# Patient Record
Sex: Female | Born: 1995 | Race: White | Hispanic: No | Marital: Single | Smoking: Current every day smoker
Health system: Southern US, Community
[De-identification: ages and names within clinical notes are randomized; demographics above are authoritative.]

## PROBLEM LIST (undated history)

## (undated) ENCOUNTER — Inpatient Hospital Stay (HOSPITAL_COMMUNITY): Payer: Self-pay

## (undated) DIAGNOSIS — Z9109 Other allergy status, other than to drugs and biological substances: Secondary | ICD-10-CM

## (undated) DIAGNOSIS — D649 Anemia, unspecified: Secondary | ICD-10-CM

## (undated) DIAGNOSIS — B999 Unspecified infectious disease: Secondary | ICD-10-CM

## (undated) DIAGNOSIS — R51 Headache: Secondary | ICD-10-CM

## (undated) DIAGNOSIS — O021 Missed abortion: Secondary | ICD-10-CM

---
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
## 2013-01-22 ENCOUNTER — Emergency Department (HOSPITAL_COMMUNITY): Payer: Medicaid Other

## 2013-01-22 ENCOUNTER — Emergency Department (HOSPITAL_COMMUNITY)
Admission: EM | Admit: 2013-01-22 | Discharge: 2013-01-22 | Disposition: A | Discharge status: Home / Self Care | Payer: Medicaid Other | Attending: Emergency Medicine | Admitting: Emergency Medicine

## 2013-01-22 DIAGNOSIS — IMO0002 Reserved for concepts with insufficient information to code with codable children: Secondary | ICD-10-CM | POA: Insufficient documentation

## 2013-01-22 DIAGNOSIS — S335XXA Sprain of ligaments of lumbar spine, initial encounter: Secondary | ICD-10-CM | POA: Insufficient documentation

## 2013-01-22 DIAGNOSIS — Y92410 Unspecified street and highway as the place of occurrence of the external cause: Secondary | ICD-10-CM | POA: Insufficient documentation

## 2013-01-22 DIAGNOSIS — F17200 Nicotine dependence, unspecified, uncomplicated: Secondary | ICD-10-CM | POA: Insufficient documentation

## 2013-01-22 DIAGNOSIS — R6884 Jaw pain: Secondary | ICD-10-CM | POA: Insufficient documentation

## 2013-01-22 DIAGNOSIS — Z3202 Encounter for pregnancy test, result negative: Secondary | ICD-10-CM | POA: Insufficient documentation

## 2013-01-22 DIAGNOSIS — S39012A Strain of muscle, fascia and tendon of lower back, initial encounter: Secondary | ICD-10-CM

## 2013-01-22 DIAGNOSIS — S86911A Strain of unspecified muscle(s) and tendon(s) at lower leg level, right leg, initial encounter: Secondary | ICD-10-CM

## 2013-01-22 DIAGNOSIS — Y9389 Activity, other specified: Secondary | ICD-10-CM | POA: Insufficient documentation

## 2013-01-22 LAB — POCT PREGNANCY, URINE: Preg Test, Ur: NEGATIVE

## 2013-01-22 MED ORDER — TRAMADOL HCL 50 MG PO TABS
50.0000 mg | ORAL_TABLET | Freq: Once | ORAL | Status: AC
  Administered 2013-01-22: 50 mg via ORAL
  Filled 2013-01-22: qty 1

## 2013-01-22 MED ORDER — TRAMADOL HCL 50 MG PO TABS: 50.0000 mg | ORAL_TABLET | Freq: Four times a day (QID) | ORAL | Status: DC | PRN

## 2013-01-22 MED ORDER — CYCLOBENZAPRINE HCL 10 MG PO TABS: 10.0000 mg | ORAL_TABLET | Freq: Two times a day (BID) | ORAL | Status: DC | PRN
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
## 2013-01-22 NOTE — ED Provider Notes (Signed)
Medical screening examination/treatment/procedure(s) were performed by non-physician practitioner and as supervising physician I was immediately available for consultation/collaboration.  EKG Interpretation   None        Emmett Bracknell, MD 01/22/13 2336 
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
## 2013-03-17 ENCOUNTER — Inpatient Hospital Stay (HOSPITAL_COMMUNITY)
Admission: AD | Admit: 2013-03-17 | Discharge: 2013-03-17 | Disposition: A | Discharge status: Home / Self Care | Payer: Medicaid Other | Source: Ambulatory Visit | Attending: Obstetrics and Gynecology | Admitting: Obstetrics and Gynecology

## 2013-03-17 ENCOUNTER — Encounter (HOSPITAL_COMMUNITY): Payer: Self-pay | Admitting: *Deleted

## 2013-03-17 ENCOUNTER — Inpatient Hospital Stay (HOSPITAL_COMMUNITY): Payer: Self-pay

## 2013-03-17 DIAGNOSIS — O26899 Other specified pregnancy related conditions, unspecified trimester: Secondary | ICD-10-CM

## 2013-03-17 DIAGNOSIS — R05 Cough: Secondary | ICD-10-CM | POA: Insufficient documentation

## 2013-03-17 DIAGNOSIS — R109 Unspecified abdominal pain: Secondary | ICD-10-CM | POA: Insufficient documentation

## 2013-03-17 DIAGNOSIS — R059 Cough, unspecified: Secondary | ICD-10-CM | POA: Insufficient documentation

## 2013-03-17 DIAGNOSIS — N390 Urinary tract infection, site not specified: Secondary | ICD-10-CM

## 2013-03-17 DIAGNOSIS — O210 Mild hyperemesis gravidarum: Secondary | ICD-10-CM | POA: Insufficient documentation

## 2013-03-17 DIAGNOSIS — R112 Nausea with vomiting, unspecified: Secondary | ICD-10-CM

## 2013-03-17 DIAGNOSIS — M542 Cervicalgia: Secondary | ICD-10-CM | POA: Insufficient documentation

## 2013-03-17 DIAGNOSIS — O99330 Smoking (tobacco) complicating pregnancy, unspecified trimester: Secondary | ICD-10-CM | POA: Insufficient documentation

## 2013-03-17 DIAGNOSIS — O2390 Unspecified genitourinary tract infection in pregnancy, unspecified trimester: Secondary | ICD-10-CM | POA: Insufficient documentation

## 2013-03-17 LAB — POCT PREGNANCY, URINE: Preg Test, Ur: POSITIVE — AB

## 2013-03-17 LAB — URINALYSIS, ROUTINE W REFLEX MICROSCOPIC
BILIRUBIN URINE: NEGATIVE
GLUCOSE, UA: NEGATIVE mg/dL
HGB URINE DIPSTICK: NEGATIVE
Ketones, ur: 15 mg/dL — AB
Nitrite: POSITIVE — AB
Protein, ur: 30 mg/dL — AB
SPECIFIC GRAVITY, URINE: 1.02 (ref 1.005–1.030)
UROBILINOGEN UA: 1 mg/dL (ref 0.0–1.0)
pH: 6.5 (ref 5.0–8.0)

## 2013-03-17 LAB — CBC
HCT: 39.9 % (ref 36.0–46.0)
Hemoglobin: 14.1 g/dL (ref 12.0–15.0)
MCH: 30.8 pg (ref 26.0–34.0)
MCHC: 35.3 g/dL (ref 30.0–36.0)
MCV: 87.1 fL (ref 78.0–100.0)
Platelets: 219 10*3/uL (ref 150–400)
RBC: 4.58 MIL/uL (ref 3.87–5.11)
RDW: 13.1 % (ref 11.5–15.5)
WBC: 14.4 10*3/uL — ABNORMAL HIGH (ref 4.0–10.5)

## 2013-03-17 LAB — WET PREP, GENITAL
Trich, Wet Prep: NONE SEEN
Yeast Wet Prep HPF POC: NONE SEEN

## 2013-03-17 LAB — URINE MICROSCOPIC-ADD ON

## 2013-03-17 MED ORDER — NITROFURANTOIN MONOHYD MACRO 100 MG PO CAPS: 100.0000 mg | ORAL_CAPSULE | Freq: Two times a day (BID) | ORAL | Status: DC

## 2013-03-17 NOTE — Discharge Instructions (Signed)
Abdominal Pain During Pregnancy °Abdominal pain is common in pregnancy. Most of the time, it does not cause harm. There are many causes of abdominal pain. Some causes are more serious than others. Some of the causes of abdominal pain in pregnancy are easily diagnosed. Occasionally, the diagnosis takes time to understand. Other times, the cause is not determined. Abdominal pain can be a sign that something is very wrong with the pregnancy, or the pain may have nothing to do with the pregnancy at all. For this reason, always tell your health care provider if you have any abdominal discomfort. °HOME CARE INSTRUCTIONS  °Monitor your abdominal pain for any changes. The following actions may help to alleviate any discomfort you are experiencing: °· Do not have sexual intercourse or put anything in your vagina until your symptoms go away completely. °· Get plenty of rest until your pain improves. °· Drink clear fluids if you feel nauseous. Avoid solid food as long as you are uncomfortable or nauseous. °· Only take over-the-counter or prescription medicine as directed by your health care provider. °· Keep all follow-up appointments with your health care provider. °SEEK IMMEDIATE MEDICAL CARE IF: °· You are bleeding, leaking fluid, or passing tissue from the vagina. °· You have increasing pain or cramping. °· You have persistent vomiting. °· You have painful or bloody urination. °· You have a fever. °· You notice a decrease in your baby's movements. °· You have extreme weakness or feel faint. °· You have shortness of breath, with or without abdominal pain. °· You develop a severe headache with abdominal pain. °· You have abnormal vaginal discharge with abdominal pain. °· You have persistent diarrhea. °· You have abdominal pain that continues even after rest, or gets worse. °MAKE SURE YOU:  °· Understand these instructions. °· Will watch your condition. °· Will get help right away if you are not doing well or get  worse. °Document Released: 02/01/2005 Document Revised: 11/22/2012 Document Reviewed: 08/31/2012 °[employer]® Patient Information ©2014 [employer]. ° °

## 2013-03-17 NOTE — MAU Provider Note (Signed)
History     CSN: [id number]  Arrival date and time: 03/17/13 1202   First Provider Initiated Contact with Patient 03/17/13 1245      Chief Complaint  Patient presents with  . Neck pain   . Abdominal Pain   HPI [name] [name] is a 18 y.o. [id number] at 3070w5d.  She expelled her Mirena in October.  She presents with multiple c/o.  She has had N&V x 3 wks- vomits 3 hr after taking PNV every day. Past 2-3 d has been vomiting every 30 minutes. No diarrhea, can't remember when has last BM-probably within  2-3 d. She has had bil low abd pain x 2-3 d, constant, sharp stabbing. Worse with movement, better with fetal position.  No bleeding or spotting, no change in discharge, odor or itching. She has noticed a knot on L side of neck x 4 d, 1-2 cm, feels  a pulse in it, hurts to turn head.    Past Medical History  Diagnosis Date  . Anemia   . Preterm labor   . Infection   . 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 Headache(784.0)     otc med prn    Past Surgical History  Procedure Laterality Date  . Cesarean section    . Cesarean section N/A 05/16/2012    Procedure: CESAREAN SECTION;  Surgeon: Bing Plumehomas F Henley, MD;  Location: [facility];  Service: Obstetrics;  Laterality: N/A;  repeat     Family History  Problem Relation Age of Onset  . Alcohol abuse Mother   . Diabetes Maternal Grandmother   . Heart disease Maternal Grandmother   . Hyperlipidemia Maternal Grandmother   . Hypertension Maternal Grandmother   . Cancer Maternal Grandmother   . COPD Maternal Grandmother   . Arthritis Maternal Grandmother   . Alcohol abuse Maternal Grandfather     History  Substance Use Topics  . Smoking status: Current Every Day Smoker -- 0.50 packs/day for 7 years    Types: Cigarettes  . Smokeless tobacco: Never Used  . Alcohol Use: 0.6 oz/week    1 Glasses of wine per week    Allergies: No Known Allergies  Prescriptions prior to admission  Medication Sig Dispense Refill  .  cyclobenzaprine (FLEXERIL) 10 MG tablet Take 1 tablet (10 mg total) by mouth 2 (two) times daily as needed for muscle spasms.  20 tablet  0  . traMADol (ULTRAM) 50 MG tablet Take 1 tablet (50 mg total) by mouth every 6 (six) hours as needed.  20 tablet  0    Review of Systems  Constitutional: Positive for weight loss. Negative for fever and chills.  HENT: Positive for sore throat. Negative for congestion and ear pain.   Respiratory: Positive for cough.   Cardiovascular: Negative.   Gastrointestinal: Positive for nausea, vomiting and abdominal pain.  Musculoskeletal: Positive for neck pain.  Neurological: Negative for headaches.   Physical Exam   Blood pressure 124/77, pulse 125, temperature 99.5 F (37.5 C), temperature source Oral, resp. rate 18, height 5\' 1"  (1.549 m), weight 166 lb 4 oz (75.411 kg), last menstrual period 01/08/2013, SpO2 100.00%, not currently breastfeeding.  Physical Exam  Constitutional: She is oriented to person, place, and time. She appears well-developed and well-nourished. No distress.  HENT:  Head: Normocephalic.  Nose: Nose normal.  Mouth/Throat: Oropharynx is clear and moist.  Neck: Normal range of motion. Neck supple. No JVD present. No tracheal deviation present. No  thyromegaly present.  Respiratory: Effort normal and breath sounds normal. No stridor. No respiratory distress. She has no wheezes. She has no rales.  GI: Soft. Bowel sounds are normal. She exhibits no distension and no mass. There is tenderness. There is no rebound and no guarding.  Genitourinary:  Pelvic exam- Ext gen- nl anatomy,skin intact Vagina- small amt creamy white discharge Cx-parous Uterus-enlarged, non tender Adn- no masses palp, R sl  tender  Musculoskeletal: Normal range of motion.  Lymphadenopathy:    She has no cervical adenopathy.  Neurological: She is alert and oriented to person, place, and time.  Skin: Skin is warm and dry.  Psychiatric: She has a normal mood and  affect. Her behavior is normal.    MAU Course  Procedures  MDM Results for orders placed during the hospital encounter of 03/17/13 (from the past 24 hour(s))  POCT PREGNANCY, URINE     Status: Abnormal   Collection Time    03/17/13 12:32 PM      Result Value Range   Preg Test, Ur POSITIVE (*) NEGATIVE  URINALYSIS, ROUTINE W REFLEX MICROSCOPIC     Status: Abnormal   Collection Time    03/17/13 12:44 PM      Result Value Range   Color, Urine YELLOW  YELLOW   APPearance HAZY (*) CLEAR   Specific Gravity, Urine 1.020  1.005 - 1.030   pH 6.5  5.0 - 8.0   Glucose, UA NEGATIVE  NEGATIVE mg/dL   Hgb urine dipstick NEGATIVE  NEGATIVE   Bilirubin Urine NEGATIVE  NEGATIVE   Ketones, ur 15 (*) NEGATIVE mg/dL   Protein, ur 30 (*) NEGATIVE mg/dL   Urobilinogen, UA 1.0  0.0 - 1.0 mg/dL   Nitrite POSITIVE (*) NEGATIVE   Leukocytes, UA SMALL (*) NEGATIVE  URINE MICROSCOPIC-ADD ON     Status: Abnormal   Collection Time    03/17/13 12:44 PM      Result Value Range   Squamous Epithelial / LPF MANY (*) RARE   WBC, UA 21-50  <3 WBC/hpf   Bacteria, UA FEW (*) RARE   Urine-Other MUCOUS PRESENT    CBC     Status: Abnormal   Collection Time    03/17/13  1:40 PM      Result Value Range   WBC 14.4 (*) 4.0 - 10.5 K/uL   RBC 4.58  3.87 - 5.11 MIL/uL   Hemoglobin 14.1  12.0 - 15.0 g/dL   HCT 40.9  81.1 - 91.4 %   MCV 87.1  78.0 - 100.0 fL   MCH 30.8  26.0 - 34.0 pg   MCHC 35.3  30.0 - 36.0 g/dL   RDW 78.2  95.6 - 21.3 %   Platelets 219  150 - 400 K/uL  WET PREP, GENITAL     Status: Abnormal   Collection Time    03/17/13  1:48 PM      Result Value Range   Yeast Wet Prep HPF POC NONE SEEN  NONE SEEN   Trich, Wet Prep NONE SEEN  NONE SEEN   Clue Cells Wet Prep HPF POC MODERATE (*) NONE SEEN   WBC, Wet Prep HPF POC MODERATE (*) NONE SEEN   US Ob Comp Less 14 Wks  03/17/2013   CLINICAL DATA:  Pelvic pain for 3 days, pregnant, 9 weeks 5 days EGA by LMP  EXAM: OBSTETRIC <14 WK [place] AND  TRANSVAGINAL OB US  TECHNIQUE: Both transabdominal and transvaginal ultrasound examinations were performed for complete evaluation  of the gestation as well as the maternal uterus, adnexal regions, and pelvic cul-de-sac. Transvaginal technique was performed to assess early pregnancy.  COMPARISON:  None for this pregnancy  FINDINGS: Intrauterine gestational sac: Visualized/normal in shape.  Yolk sac:  Present  Embryo:  Present  Cardiac Activity: Present  Heart Rate:  162 bpm  CRL:   16.8  mm   6 w 4 d                  [place] EDC: 11/05/2013  Maternal uterus/adnexae:  No subchorionic hemorrhage.  Left ovary normal size and morphology 3.2 x 1.7 x 1.9 cm.  Right ovary normal size and morphology, 3.4 x 2.0 x 2.1 cm.  No free pelvic fluid or adnexal masses.  IMPRESSION: Single live early intrauterine gestation as above.  No acute abnormalities.   Electronically Signed   By: Ulyses Southward M.D.   On: 03/17/2013 14:54   US Ob Transvaginal  03/17/2013   CLINICAL DATA:  Pelvic pain for 3 days, pregnant, 9 weeks 5 days EGA by LMP  EXAM: OBSTETRIC <14 WK [place] AND TRANSVAGINAL OB US  TECHNIQUE: Both transabdominal and transvaginal ultrasound examinations were performed for complete evaluation of the gestation as well as the maternal uterus, adnexal regions, and pelvic cul-de-sac. Transvaginal technique was performed to assess early pregnancy.  COMPARISON:  None for this pregnancy  FINDINGS: Intrauterine gestational sac: Visualized/normal in shape.  Yolk sac:  Present  Embryo:  Present  Cardiac Activity: Present  Heart Rate:  162 bpm  CRL:   16.8  mm   6 w 4 d                  [place] EDC: 11/05/2013  Maternal uterus/adnexae:  No subchorionic hemorrhage.  Left ovary normal size and morphology 3.2 x 1.7 x 1.9 cm.  Right ovary normal size and morphology, 3.4 x 2.0 x 2.1 cm.  No free pelvic fluid or adnexal masses.  IMPRESSION: Single live early intrauterine gestation as above.  No acute abnormalities.   Electronically Signed   By: Ulyses Southward  M.D.   On: 03/17/2013 14:54     Assessment and Plan  6 4/7 wks IUP UTI- tx with Macrobid-C&S pending Nausea and vomiting- management reviewed, pt declines offer for Rx OTC tx for  Cough reviewed Preg verification letter to pt,call for an appt for care when gets Medicaid    [name] [name] M. 03/17/2013, 1:18 PM

## 2013-03-17 NOTE — MAU Note (Signed)
Pt states here for neck pain on left side near jawline that radiates down her back and wraps around abdomen bilaterally. Denies bleeding or abnormal vaginal discharge. Pain began yesterday.

## 2013-03-17 NOTE — MAU Note (Signed)
Also has had n/v/d x2-3 days. Temp 99.5 in triage. Also has sore throat and congestion/cough.

## 2013-03-19 ENCOUNTER — Encounter (HOSPITAL_COMMUNITY): Payer: Self-pay | Admitting: Emergency Medicine

## 2013-03-19 ENCOUNTER — Emergency Department (HOSPITAL_COMMUNITY)
Admission: EM | Admit: 2013-03-19 | Discharge: 2013-03-19 | Disposition: A | Discharge status: Home / Self Care | Payer: Medicaid Other | Attending: Emergency Medicine | Admitting: Emergency Medicine

## 2013-03-19 DIAGNOSIS — Z8619 Personal history of other infectious and parasitic diseases: Secondary | ICD-10-CM | POA: Insufficient documentation

## 2013-03-19 DIAGNOSIS — O9989 Other specified diseases and conditions complicating pregnancy, childbirth and the puerperium: Secondary | ICD-10-CM | POA: Insufficient documentation

## 2013-03-19 DIAGNOSIS — J029 Acute pharyngitis, unspecified: Secondary | ICD-10-CM

## 2013-03-19 DIAGNOSIS — Z3490 Encounter for supervision of normal pregnancy, unspecified, unspecified trimester: Secondary | ICD-10-CM

## 2013-03-19 DIAGNOSIS — H9209 Otalgia, unspecified ear: Secondary | ICD-10-CM | POA: Insufficient documentation

## 2013-03-19 DIAGNOSIS — Z862 Personal history of diseases of the blood and blood-forming organs and certain disorders involving the immune mechanism: Secondary | ICD-10-CM | POA: Insufficient documentation

## 2013-03-19 DIAGNOSIS — G479 Sleep disorder, unspecified: Secondary | ICD-10-CM | POA: Insufficient documentation

## 2013-03-19 DIAGNOSIS — R630 Anorexia: Secondary | ICD-10-CM | POA: Insufficient documentation

## 2013-03-19 DIAGNOSIS — Z792 Long term (current) use of antibiotics: Secondary | ICD-10-CM | POA: Insufficient documentation

## 2013-03-19 DIAGNOSIS — O99330 Smoking (tobacco) complicating pregnancy, unspecified trimester: Secondary | ICD-10-CM | POA: Insufficient documentation

## 2013-03-19 LAB — URINE CULTURE: Colony Count: >=100000

## 2013-03-19 LAB — GC/CHLAMYDIA PROBE AMP
CT Probe RNA: NEGATIVE
GC Probe RNA: NEGATIVE

## 2013-03-19 LAB — RAPID STREP SCREEN (MED CTR MEBANE ONLY): STREPTOCOCCUS, GROUP A SCREEN (DIRECT): NEGATIVE

## 2013-03-19 MED ORDER — ACETAMINOPHEN 325 MG PO TABS
975.0000 mg | ORAL_TABLET | Freq: Once | ORAL | Status: AC
  Administered 2013-03-19: 975 mg via ORAL
  Filled 2013-03-19: qty 3

## 2013-03-19 MED ORDER — ACETAMINOPHEN 325 MG PO TABS
650.0000 mg | ORAL_TABLET | Freq: Four times a day (QID) | ORAL | Status: DC | PRN
Start: 2013-03-19 — End: 2013-09-25

## 2013-03-19 NOTE — Discharge Instructions (Signed)
Read the information below.  Use the prescribed medication as directed.  Please discuss all new medications with your pharmacist.  You may return to the Emergency Department at any time for worsening condition or any new symptoms that concern you.  If you develop high fevers, difficulty swallowing or breathing, or you are unable to tolerate fluids by mouth, return to the ER immediately for a recheck.      Medicines During Pregnancy During pregnancy, there are medicines that are either safe or unsafe to take. Medicines include prescriptions from your caregiver, over-the-counter medicines, topical creams applied to the skin, and all herbal substances. Medicines are put into either Class A, B, C, or D. Class A and B medicines have been shown to be safe in pregnancy. Class C medicines are also considered to be safe in pregnancy, but these medicines should only be used when necessary. Class D medicines should not be used at all in pregnancy. They can be harmful to a baby.  It is best to take as little medicine as possible while pregnant. However, some medicines are necessary to take for the mother and baby's health. Sometimes, it is more dangerous to stop taking certain medicines than to stay on them. This is often the case for people with long-term (chronic) conditions such as asthma, diabetes, or high blood pressure (hypertension). If you are pregnant and have a chronic illness, call your caregiver right away. Bring a list of your medicines and their doses to your appointments. If you are planning to become pregnant, schedule a doctor's appointment and discuss your medicines with your caregiver. Lastly, write down the phone number to your pharmacist. They can answer questions regarding a medicine's class and safety. They cannot give advice as to whether you should or should not be on a medicine.  SAFE AND UNSAFE MEDICINES There is a long list of medicines that are considered safe in pregnancy. Below is a  shorter list. For specific medicines, ask your caregiver.  AllergyMedicines Loratadine, cetirizine, and chlorpheniramine are safe to take. Certain nasal steroid sprays are safe. Talk to your caregiver about specific brands that are safe. Analgesics Acetaminophen and acetaminophen with codeine are safe to take. All other nonsteroidal anti-inflammatory drugs (NSAIDS) are not safe. This includes ibuprofen.  Antacids Many over-the-counter antacids are safe to take. Talk to your caregiver about specific brands that are safe. Famotidine, ranitidine, and lansoprazole are safe. Omepresole is considered safe to take in the second trimester. Antibiotic Medicines There are several antibiotics to avoid. These include, but are not limited to, tetracyline, quinolones, and sulfa medications. Talk to your caregiver before taking any antibiotic.  Antihistamines Talk to your caregiver about specific brands that are safe.  Asthma Medicines Most asthma steroid inhalers are safe to take. Talk to your caregiver for specific details. Calcium Calcium supplements are safe to take. Do not take oyster shell calcium.  Cough and Cold Medicines It is safe to take products with guaifenesin or dextromethorphan. Talk to your caregiver about specific brands that are safe. It is not safe to take products that contain aspirin or ibuprofen. Decongestant Medicines Pseudoephedrine-containing products are safe to take in the second and third trimester.  Depression Medicines Talk about these medicines with your caregiver.  Antidiarrheal Medicines It is safe to take loperamide. Talk to your caregiver about specific brands that are safe. It is not safe to take any antidiarrheal medicine that contains bismuth. Eyedrops Allergy eyedrops should be limited.  Iron It is safe to use certain iron-containing  medicines for anemia in pregnancy. They require a prescription.  Antinausea Medicines It is safe to take doxylamine and  vitamin B6 as directed. There are other prescription medicines available, if needed.  Sleep aids It is safe to take diphenhydramine and acetaminophen with diphenhydramine.  Steroids Hydrocortisone creams are safe to use as directed. Oral steroids require a prescription. It is not safe to take any hemorrhoid cream with pramoxine or phenylephrine. Stool softener It is safe to take stool softener medicines. Avoid daily or prolonged use of stool softeners. Thyroid Medicine It is important to stay on this thyroid medicine. It needs to be followed by your caregiver.  Vaginal Medicines Your caregiver will prescribe a medicine to you if you have a vaginal infection. Certain antifungal medicines are safe to use if you have a sexually transmitted infection (STI). Talk to your caregiver.  Document Released: 02/01/2005 Document Revised: 04/26/2011 Document Reviewed: 02/02/2011 [facility] Patient Information 2014 [place], [place].  Pharyngitis Pharyngitis is a sore throat (pharynx). There is redness, pain, and swelling of your throat. HOME CARE   Drink enough fluids to keep your pee (urine) clear or pale yellow.  Only take medicine as told by your doctor.  You may get sick again if you do not take medicine as told. Finish your medicines, even if you start to feel better.  Do not take aspirin.  Rest.  Rinse your mouth (gargle) with salt water ( tsp of salt per 1 qt of water) every 1 2 hours. This will help the pain.  If you are not at risk for choking, you can suck on hard candy or sore throat lozenges. GET HELP IF:  You have large, tender lumps on your neck.  You have a rash.  You cough up green, yellow-brown, or bloody spit. GET HELP RIGHT AWAY IF:   You have a stiff neck.  You drool or cannot swallow liquids.  You throw up (vomit) or are not able to keep medicine or liquids down.  You have very bad pain that does not go away with medicine.  You have problems breathing (not  from a stuffy nose). MAKE SURE YOU:   Understand these instructions.  Will watch your condition.  Will get help right away if you are not doing well or get worse. Document Released: 07/21/2007 Document Revised: 11/22/2012 Document Reviewed: 10/09/2012 [facility] Patient Information 2014 [place], [place].   Emergency Department Resource Guide 1) Find a Doctor and Pay Out of Pocket Although you won't have to find out who is covered by your insurance plan, it is a good idea to ask around and get recommendations. You will then need to call the office and see if the doctor you have chosen will accept you as a new patient and what types of options they offer for patients who are self-pay. Some doctors offer discounts or will set up payment plans for their patients who do not have insurance, but you will need to ask so you aren't surprised when you get to your appointment.  2) Contact Your Local Health Department Not all health departments have doctors that can see patients for sick visits, but many do, so it is worth a call to see if yours does. If you don't know where your local health department is, you can check in your phone book. The CDC also has a tool to help you locate your state's health department, and many state websites also have listings of all of their local health departments.  3) Find a Walk-in  Clinic If your illness is not likely to be very severe or complicated, you may want to try a walk in clinic. These are popping up all over the country in pharmacies, drugstores, and shopping centers. They're usually staffed by nurse practitioners or physician assistants that have been trained to treat common illnesses and complaints. They're usually fairly quick and inexpensive. However, if you have serious medical issues or chronic medical problems, these are probably not your best option.  No Primary Care Doctor: - Call Health Connect at  [phone] - they can help you locate a primary care  doctor that  accepts your insurance, provides certain services, etc. - Physician Referral Service- [phone]  Chronic Pain Problems: Organization         Address  Phone   Notes  [facility]  [phone] Patients need to be referred by their primary care doctor.   Medication Assistance: Organization         Address  Phone   Notes  [facility] Medication [facility] [address] [place], [place] [address] [phone] --Must be a resident of [facility] -- Must have NO insurance coverage whatsoever (no Medicaid/ Medicare, etc.) -- The pt. MUST have a primary care doctor that directs their care regularly and follows them in the community   Med[facility]ist  [phone]   [employer]  [phone]    Agencies that provide inexpensive medical care: Organization         Address  Phone   Notes  [facility]  [phone]   [facility]    [phone]   [facility] [address] [place], [place] [address] [phone]   [facility] [address], [place] [phone]   Planned Parenthood    [phone]   [facility]    [phone]   [facility]  [address], [place] Phone:  [phone], Fax:  [phone] Hours of Operation:  9 am - 6 pm, M-F.  Also accepts Medicaid/Medicare and self-pay.  [facility]  [address], [place] Phone: [phone], Fax: [phone]. Hours of Operation:  8:30 am - 5:30 pm, M-F.  Also accepts Medicaid and self-pay.  [facility] [place] [address] [place] Phone: [phone]   [facility] [address] Natasha Bence [place], [place] [phone], Ext. [phone] Mondays & Thursdays: 7-9 AM.  First 15 patients are seen on a first come, first serve basis.    Medicaid-accepting [facility]  Providers:  Organization         Address  Phone   Notes  [facility] [address], [place] [phone] Also accepts self-pay patients.  [facility] [address] Laurell Josephs [place], [place]  [phone]   [facility] [address], [place] [phone]   [facility] [address] [place] [phone]   Renaye Rakers [address], [place]   [phone] Only accepts [place] patients after they have their name applied to their card.   Self-Pay (no insurance) in [facility]:  Organization         Address  Phone   Notes  Sickle Cell Patients, Engineer, technical sales Internal Medicine [address] [place], [place] (  [phone]) [phone]   [facility] [address], [place] [phone]   [facility]  [address], [place] [phone]   [facility]/Dr. Osei-Bonsu  [address] [place] [address], [place] or [phone], [place] [phone] Phone number for both [place] and [place] locations is the same.  [facility] [address], [place] [phone]   [facility] [address] [place] [address], [place] or [address] [phone]   [facility] [address], [place] [phone], phone; [phone], fax Sees patients 1st and 3rd Saturday of every month.  Must not qualify for public or private insurance (i.e. Medicaid, Medicare, [place] Health Choice, Veterans' Benefits)  Household income should be no more than 200% of the poverty level The clinic cannot treat you if you are pregnant or think you are pregnant  Sexually transmitted diseases are not treated at the clinic.    Dental Care: Organization         Address  Phone  Notes  [facility] [address], [place] [phone] Accepts children up to age 20 who are enrolled in [place] or [place] Health Choice; pregnant women with a Medicaid card; and children who have applied for Medicaid or [place] Health Choice, but were declined, whose parents can pay a reduced fee at time of service.  [facility] [place]  [address], [place] [phone] Accepts children up to age 20 who are enrolled in [place] or [place] Health Choice; pregnant women with a Medicaid card; and children who have applied for Medicaid or [place] Health Choice, but were declined, whose parents can pay a reduced fee at time of service.  [facility]  [address], [place] [phone] Patients are seen by appointment only. Walk-ins are not accepted. [facility] will see patients 20 years of age and older. Monday - Tuesday (8am-5pm) Most Wednesdays (8:30-5pm) $30 per visit, cash only  [facility]  [address], [facility] [phone] Patients are seen by appointment only. Walk-ins are not accepted. [facility] will see patients 20 years of age and older. One Wednesday Evening (Monthly: Volunteer Based).  $30 per visit, cash only  [employer] [facility] [facility]  [phone] for adults; Children under age 434, call Graduate Pediatric Dentistry at [phone]. Children aged 384-14, please call [phone] to request a pediatric application.  Dental services are provided in all areas of dental care including fillings, crowns and bridges, complete and partial dentures, implants, gum treatment, root canals, and extractions. Preventive care is also provided. Treatment is provided to both adults and children. Patients are selected via a lottery and there is often a waiting list.   [facility] [address], [place]  [phone] [email or web address]   [facility]  [address], [place], [place] [phone], Ext. [phone] Second and Fourth Thursday of each month, opens at 6:30 AM; Clinic ends at 9 AM.  Patients are seen on a first-come first-served basis, and a limited number are seen during each clinic.   [facility]  [address], [place], [place] [phone]   Eligibility Requirements You must have lived in [place], or [place] counties  for at least the last three months.   You cannot be eligible for state or federal sponsored National Cityhealthcare insurance, including CIGNAVeterans Administration, [place]icaid, or [employer]Medicare.   You generally cannot be eligible for healthcare insurance through your employer.    How to apply: Eligibility screenings are held every Tuesday and Wednesday afternoon from 1:00 pm until 4:00 pm. You do not need an appointment for the interview!  [facility] [address], [place], [place] [phone]   [facility]  [phone]   [facility]  [phone]   [facility]  [phone]    Behavioral Health Resources in the Community: Intensive Outpatient Programs Organization         Address  Phone  Notes  [facility] [address], [place], [place] [phone]   [facility] Out[facility] [address], [place], [place] [phone]   [facility] [address], [place], [place]  [phone]   [facility] [address],  [place], [place] [phone] or [phone]   Substance Abuse Resources Organization         Address  Phone  Notes  [facility]  [phone]   [facility]  [phone]   [facility]  [phone]   [facility]  [phone]   Residential & Outpatient Substance Abuse Program  [phone]   Psychological Services Organization         Address  Phone  Notes  [facility]  [phone]- [phone]    [facility]  [phone]- [phone]   [facility] [address], [place] [phone] or [phone]    Mobile Crisis Teams Organization         Address  Phone  Notes  [facility], Mobile Crisis Care Unit  [phone]   [facility]  [address] [place], [place] [phone]   Doristine LocksSharon DeEsch [address] [place] [phone]    Self-Help/Support Groups Organization         Address  Phone             Notes  [facility] [place] - variety of support groups  [phone] Call for more information  Narcotics Anonymous (NA), [facility] [address], [place]  2 meetings at this location   Statisticianesidential Treatment Programs Organization         Address  Phone  Notes  [facility] [address],    [place]  [phone]   [facility]  [address], [place] [phone], [place], [place] [phone]   [facility] [address], [place] [phone] Admissions: 8am-3pm M-F  [facility] [address]    [place], [place] [phone]   [facility] [address], [place], [place] [phone]   [facility] [address]  [place], [place] [phone]   [facility] - Intensive Outpatient [address], [place], [place] [phone]   [facility] ([facility] [address]  [place], [place] [phone] or [phone]   [facility] ([facility]) [address] [place], [place] [phone] Accepts Medicaid  [facility] [address]  [place] [phone] Substance Abuse/Addiction Treatment   [facility] Resources Organization         Address  Phone  Notes  [facility]  [phone]  [phone]   Angie Fava, PhD [address] Ervin Knack [place], [place]   [phone] or [phone]    [facility] Behavioral   [address] [place], [place] [phone]   [facility] [address] [place], [place] [phone] Insurance/Medicaid/sponsorship through [facility] [address]                                    [place], [place] [phone] Therapy/tele-psych/case  [facility] [address] [place], [place] [phone]    Dr. Lolly Mustache  [phone]   [facility] [place]  [employer] [facility] Dept. 1) [address] [place] 2) [address], [place] 3)  [address], [place] [phone]  [phone]   [facility] Child Abuse Hotline [phone] or [phone] (After Hours)

## 2013-03-19 NOTE — ED Provider Notes (Signed)
CSN: [id number]     Arrival date & time 03/19/13  1751 History  This chart was scribed for non-physician practitioner Trixie Dredge, PA-C working with Hurman Horn, MD by Joaquin Music, ED Scribe. This patient was seen in room WTR9/WTR9 and the patient's care was started at  6:47 PM  Chief Complaint  Patient presents with  . Sore Throat  . Cough  . Nasal Congestion   Patient is a 18 y.o. female presenting with pharyngitis and cough.  Sore Throat Pertinent negatives include no abdominal pain.  Cough Associated symptoms: ear pain, fever and sore throat    HPI Comments: [name] [name] is a 18 y.o. female who presents to the Emergency Department complaining of ongoing sore throat , fever, bilateral ear pain, congestion, appetite changes, and decrease sleep that began 4 days ago. Pt states she had a fever of 101 yesterday evening. She states she has been been having intermittent cough with white sputum and reports feeling SOB prior to coughing only. Pt denies taking any OTC medications. Pt denies emesis and diarrhea.  Pt states she had just found out she was [redacted] weeks pregnant. Pt denies abd pain, vaginal bleeding and discharge.   Past Medical History  Diagnosis Date  . Anemia   . Preterm labor   . Infection   . 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 Headache(784.0)     otc med prn   Past Surgical History  Procedure Laterality Date  . Cesarean section    . Cesarean section N/A 05/16/2012    Procedure: CESAREAN SECTION;  Surgeon: Bing Plume, MD;  Location: [facility];  Service: Obstetrics;  Laterality: N/A;  repeat    Family History  Problem Relation Age of Onset  . Alcohol abuse Mother   . Diabetes Maternal Grandmother   . Heart disease Maternal Grandmother   . Hyperlipidemia Maternal Grandmother   . Hypertension Maternal Grandmother   . Cancer Maternal Grandmother   . COPD Maternal Grandmother   . Arthritis Maternal Grandmother   . Alcohol abuse  Maternal Grandfather    History  Substance Use Topics  . Smoking status: Current Every Day Smoker -- 0.50 packs/day for 7 years    Types: Cigarettes  . Smokeless tobacco: Never Used  . Alcohol Use: 0.6 oz/week    1 Glasses of wine per week   OB History   Grav Para Term Preterm Abortions TAB SAB Ect Mult Living   6 2 1 1 3  0 3 0 0 2     Review of Systems  Constitutional: Positive for fever and appetite change.  HENT: Positive for congestion, ear pain and sore throat.   Respiratory: Positive for cough.   Gastrointestinal: Negative for vomiting, abdominal pain and diarrhea.  Genitourinary: Negative for vaginal bleeding and vaginal discharge.    Allergies  Review of patient's allergies indicates no known allergies.  Home Medications   Current Outpatient Rx  Name  Route  Sig  Dispense  Refill  . nitrofurantoin, macrocrystal-monohydrate, (MACROBID) 100 MG capsule   Oral   Take 1 capsule (100 mg total) by mouth 2 (two) times daily.   14 capsule   0    Triage Vitals:BP 110/65  Pulse 102  Temp(Src) 98.2 F (36.8 C) (Oral)  Resp 18  SpO2 95%  LMP 01/08/2013  Physical Exam  Nursing note and vitals reviewed. Constitutional: She appears well-developed and well-nourished. No distress.  HENT:  Head: Normocephalic and atraumatic.  Mouth/Throat: Uvula is midline. Mucous membranes are not dry. Posterior oropharyngeal edema and posterior oropharyngeal erythema present. No oropharyngeal exudate or tonsillar abscesses.  Anterior cervical adenopathy. Bilateral tonsils are enlarged and erythematous. TMs are normal bilaterally. Frontal and maxillary sinuses are non-tender.  Neck: Neck supple.  Cardiovascular: Normal rate, regular rhythm and normal heart sounds.   Pulmonary/Chest: Effort normal and breath sounds normal. No respiratory distress. She has no wheezes. She has no rales.  Abdominal: Soft. She exhibits no distension. There is no tenderness. There is no rebound and no guarding.   Neurological: She is alert.  Skin: She is not diaphoretic.    ED Course  Procedures DIAGNOSTIC STUDIES: Oxygen Saturation is 95% on RA, nomral by my interpretation.    COORDINATION OF CARE: 6:52 PM-Discussed treatment plan which includes rapid strep test and administer Tylenol while in ED. Pt agreed to plan.   7:51 PM -Discussed lab findings with patient. Patient agreed to tx.  Labs Review Labs Reviewed - No data to display Imaging Review No results found.  EKG Interpretation   None      MDM   1. Pharyngitis   2. Pregnancy    Afebrile, nontoxic patient with constellation of symptoms suggestive of viral syndrome.  Possible strep throat, will wait for throat culture prior to treatment with antibiotics as patient is currently pregnant and I do not want to treat a viral infection with unnecessary antibiotics.  No findings concerning for peritonsillar abscess on exam.  No airway concerns.  Oral mucosa is moist. Discharged home with tylenol, encouraged oral hydration, PCP follow up. Discussed result, findings, treatment, and follow up  with patient.  Pt given return precautions.  Pt verbalizes understanding and agrees with plan.       I personally performed the services described in this documentation, which was scribed in my presence. The recorded information has been reviewed and is accurate.   Trixie Dredgemily Kaisy Severino, PA-C 03/19/13 1953

## 2013-03-19 NOTE — ED Notes (Signed)
Pt states she has had sore throat, cough, and congestion since Thursday of last week. Pt had a fever of 101 yesterday. Pt is [redacted] weeks pregnant.

## 2013-03-21 LAB — CULTURE, GROUP A STREP

## 2013-03-23 NOTE — ED Provider Notes (Signed)
Medical screening examination/treatment/procedure(s) were performed by non-physician practitioner and as supervising physician I was immediately available for consultation/collaboration.  Tiana Sivertson M Annora Guderian, MD 03/23/13 1955 

## 2013-04-25 ENCOUNTER — Inpatient Hospital Stay (HOSPITAL_COMMUNITY)
Admission: AD | Admit: 2013-04-25 | Discharge: 2013-04-25 | Discharge status: Against Medical Advice | Payer: Medicaid Other | Source: Ambulatory Visit | Attending: Obstetrics and Gynecology | Admitting: Obstetrics and Gynecology

## 2013-04-25 NOTE — MAU Note (Signed)
Not in lobby

## 2013-05-20 ENCOUNTER — Inpatient Hospital Stay (HOSPITAL_COMMUNITY)
Admission: AD | Admit: 2013-05-20 | Discharge: 2013-05-21 | Discharge status: Against Medical Advice | Payer: Medicaid Other | Source: Ambulatory Visit | Attending: Obstetrics and Gynecology | Admitting: Obstetrics and Gynecology

## 2013-05-20 ENCOUNTER — Encounter (HOSPITAL_COMMUNITY): Payer: Self-pay | Admitting: *Deleted

## 2013-05-20 DIAGNOSIS — O209 Hemorrhage in early pregnancy, unspecified: Secondary | ICD-10-CM | POA: Insufficient documentation

## 2013-05-20 DIAGNOSIS — R109 Unspecified abdominal pain: Secondary | ICD-10-CM | POA: Insufficient documentation

## 2013-05-20 NOTE — MAU Note (Signed)
Bright red bleeding started at 1030pm tonight, with clots. Lower abdominal pain that started before the bleeding. Denies intercourse within the last week. Denies vaginal discharge.

## 2013-05-21 NOTE — MAU Note (Signed)
Pt called out and told RN she wanted to leave.  Pt stated after she heard FHR that was all she needed to hear to feel better and she did not want any further treatment.  Pt strongly urged to stay for further examination of her bleeding but she refused, got dressed and left against medical advice.

## 2013-05-30 ENCOUNTER — Encounter: Payer: Medicaid Other | Admitting: Family Medicine

## 2013-06-13 LAB — OB RESULTS CONSOLE ABO/RH: RH TYPE: POSITIVE

## 2013-06-13 LAB — OB RESULTS CONSOLE RUBELLA ANTIBODY, IGM: Rubella: NON-IMMUNE/NOT IMMUNE

## 2013-06-13 LAB — OB RESULTS CONSOLE HIV ANTIBODY (ROUTINE TESTING): HIV: NONREACTIVE

## 2013-06-13 LAB — OB RESULTS CONSOLE GC/CHLAMYDIA
Chlamydia: NEGATIVE
Gonorrhea: NEGATIVE

## 2013-06-13 LAB — OB RESULTS CONSOLE ANTIBODY SCREEN: Antibody Screen: NEGATIVE

## 2013-06-13 LAB — OB RESULTS CONSOLE RPR: RPR: NONREACTIVE

## 2013-09-25 ENCOUNTER — Inpatient Hospital Stay (HOSPITAL_COMMUNITY)
Admission: AD | Admit: 2013-09-25 | Discharge: 2013-09-25 | Disposition: A | Discharge status: Home / Self Care | Payer: Medicaid Other | Source: Ambulatory Visit | Attending: Obstetrics and Gynecology | Admitting: Obstetrics and Gynecology

## 2013-09-25 ENCOUNTER — Encounter (HOSPITAL_COMMUNITY): Payer: Self-pay | Admitting: *Deleted

## 2013-09-25 DIAGNOSIS — N390 Urinary tract infection, site not specified: Secondary | ICD-10-CM | POA: Diagnosis not present

## 2013-09-25 DIAGNOSIS — N930 Postcoital and contact bleeding: Secondary | ICD-10-CM

## 2013-09-25 DIAGNOSIS — O4690 Antepartum hemorrhage, unspecified, unspecified trimester: Secondary | ICD-10-CM | POA: Insufficient documentation

## 2013-09-25 DIAGNOSIS — O99330 Smoking (tobacco) complicating pregnancy, unspecified trimester: Secondary | ICD-10-CM | POA: Insufficient documentation

## 2013-09-25 DIAGNOSIS — O4700 False labor before 37 completed weeks of gestation, unspecified trimester: Secondary | ICD-10-CM | POA: Insufficient documentation

## 2013-09-25 DIAGNOSIS — O2390 Unspecified genitourinary tract infection in pregnancy, unspecified trimester: Secondary | ICD-10-CM | POA: Diagnosis not present

## 2013-09-25 DIAGNOSIS — O368131 Decreased fetal movements, third trimester, fetus 1: Secondary | ICD-10-CM

## 2013-09-25 DIAGNOSIS — O368190 Decreased fetal movements, unspecified trimester, not applicable or unspecified: Secondary | ICD-10-CM | POA: Insufficient documentation

## 2013-09-25 DIAGNOSIS — O34219 Maternal care for unspecified type scar from previous cesarean delivery: Secondary | ICD-10-CM | POA: Insufficient documentation

## 2013-09-25 DIAGNOSIS — O2343 Unspecified infection of urinary tract in pregnancy, third trimester: Secondary | ICD-10-CM

## 2013-09-25 LAB — WET PREP, GENITAL
Clue Cells Wet Prep HPF POC: NONE SEEN
Trich, Wet Prep: NONE SEEN
Yeast Wet Prep HPF POC: NONE SEEN

## 2013-09-25 LAB — URINALYSIS, ROUTINE W REFLEX MICROSCOPIC
Bilirubin Urine: NEGATIVE
Glucose, UA: NEGATIVE mg/dL
KETONES UR: NEGATIVE mg/dL
NITRITE: NEGATIVE
PH: 6.5 (ref 5.0–8.0)
PROTEIN: NEGATIVE mg/dL
Specific Gravity, Urine: <1.005 — ABNORMAL LOW (ref 1.005–1.030)
Urobilinogen, UA: 0.2 mg/dL (ref 0.0–1.0)

## 2013-09-25 LAB — URINE MICROSCOPIC-ADD ON

## 2013-09-25 MED ORDER — NITROFURANTOIN MONOHYD MACRO 100 MG PO CAPS: 100.0000 mg | ORAL_CAPSULE | Freq: Two times a day (BID) | ORAL | Status: DC

## 2013-09-25 NOTE — Discharge Instructions (Signed)
Braxton Hicks Contractions °Contractions of the uterus can occur throughout pregnancy. Contractions are not always a sign that you are in labor.  °WHAT ARE BRAXTON HICKS CONTRACTIONS?  °Contractions that occur before labor are called Braxton Hicks contractions, or false labor. Toward the end of pregnancy (32-34 weeks), these contractions can develop more often and may become more forceful. This is not true labor because these contractions do not result in opening (dilatation) and thinning of the cervix. They are sometimes difficult to tell apart from true labor because these contractions can be forceful and people have different pain tolerances. You should not feel embarrassed if you go to the hospital with false labor. Sometimes, the only way to tell if you are in true labor is for your health care provider to look for changes in the cervix. °If there are no prenatal problems or other health problems associated with the pregnancy, it is completely safe to be sent home with false labor and await the onset of true labor. °HOW CAN YOU TELL THE DIFFERENCE BETWEEN TRUE AND FALSE LABOR? °False Labor °· The contractions of false labor are usually shorter and not as hard as those of true labor.   °· The contractions are usually irregular.   °· The contractions are often felt in the front of the lower abdomen and in the groin.   °· The contractions may go away when you walk around or change positions while lying down.   °· The contractions get weaker and are shorter lasting as time goes on.   °· The contractions do not usually become progressively stronger, regular, and closer together as with true labor.   °True Labor °· Contractions in true labor last 30-70 seconds, become very regular, usually become more intense, and increase in frequency.   °· The contractions do not go away with walking.   °· The discomfort is usually felt in the top of the uterus and spreads to the lower abdomen and low back.   °· True labor can be  determined by your health care provider with an exam. This will show that the cervix is dilating and getting thinner.   °WHAT TO REMEMBER °· Keep up with your usual exercises and follow other instructions given by your health care provider.   °· Take medicines as directed by your health care provider.   °· Keep your regular prenatal appointments.   °· Eat and drink lightly if you think you are going into labor.   °· If Braxton Hicks contractions are making you uncomfortable:   °¨ Change your position from lying down or resting to walking, or from walking to resting.   °¨ Sit and rest in a tub of warm water.   °¨ Drink 2-3 glasses of water. Dehydration may cause these contractions.   °¨ Do slow and deep breathing several times an hour.   °WHEN SHOULD I SEEK IMMEDIATE MEDICAL CARE? °Seek immediate medical care if: °· Your contractions become stronger, more regular, and closer together.   °· You have fluid leaking or gushing from your vagina.   °· You have a fever.   °· You pass blood-tinged mucus.   °· You have vaginal bleeding.   °· You have continuous abdominal pain.   °· You have low back pain that you never had before.   °· You feel your baby's head pushing down and causing pelvic pressure.   °· Your baby is not moving as much as it used to.   °Document Released: 02/01/2005 Document Revised: 02/06/2013 Document Reviewed: 11/13/2012 °ExitCare® Patient Information ©2015 [employer]. This information is not intended to replace advice given to you by your health care   provider. Make sure you discuss any questions you have with your health care provider.  Pregnancy and Urinary Tract Infection A urinary tract infection (UTI) is a bacterial infection of the urinary tract. Infection of the urinary tract can include the ureters, kidneys (pyelonephritis), bladder (cystitis), and urethra (urethritis). All pregnant women should be screened for bacteria in the urinary tract. Identifying and treating a UTI will decrease the  risk of preterm labor and developing more serious infections in both the mother and baby. CAUSES Bacteria germs cause almost all UTIs.  RISK FACTORS Many factors can increase your chances of getting a UTI during pregnancy. These include:  Having a short urethra.  Poor toilet and hygiene habits.  Sexual intercourse.  Blockage of urine along the urinary tract.  Problems with the pelvic muscles or nerves.  Diabetes.  Obesity.  Bladder problems after having several children.  Previous history of UTI. SIGNS AND SYMPTOMS   Pain, burning, or a stinging feeling when urinating.  Suddenly feeling the need to urinate right away (urgency).  Loss of bladder control (urinary incontinence).  Frequent urination, more than is common with pregnancy.  Lower abdominal or back discomfort.  Cloudy urine.  Blood in the urine (hematuria).  Fever. When the kidneys are infected, the symptoms may be:  Back pain.  Flank pain on the right side more so than the left.  Fever.  Chills.  Nausea.  Vomiting. DIAGNOSIS  A urinary tract infection is usually diagnosed through urine tests. Additional tests and procedures are sometimes done. These may include:  Ultrasound exam of the kidneys, ureters, bladder, and urethra.  Looking in the bladder with a lighted tube (cystoscopy). TREATMENT Typically, UTIs can be treated with antibiotic medicines.  HOME CARE INSTRUCTIONS   Only take over-the-counter or prescription medicines as directed by your health care provider. If you were prescribed antibiotics, take them as directed. Finish them even if you start to feel better.  Drink enough fluids to keep your urine clear or pale yellow.  Do not have sexual intercourse until the infection is gone and your health care provider says it is okay.  Make sure you are tested for UTIs throughout your pregnancy. These infections often come back. Preventing a UTI in the Future  Practice good toilet  habits. Always wipe from front to back. Use the tissue only once.  Do not hold your urine. Empty your bladder as soon as possible when the urge comes.  Do not douche or use deodorant sprays.  Wash with soap and warm water around the genital area and the anus.  Empty your bladder before and after sexual intercourse.  Wear underwear with a cotton crotch.  Avoid caffeine and carbonated drinks. They can irritate the bladder.  Drink cranberry juice or take cranberry pills. This may decrease the risk of getting a UTI.  Do not drink alcohol.  Keep all your appointments and tests as scheduled. SEEK MEDICAL CARE IF:   Your symptoms get worse.  You are still having fevers 2 or more days after treatment begins.  You have a rash.  You feel that you are having problems with medicines prescribed.  You have abnormal vaginal discharge. SEEK IMMEDIATE MEDICAL CARE IF:   You have back or flank pain.  You have chills.  You have blood in your urine.  You have nausea and vomiting.  You have contractions of your uterus.  You have a gush of fluid from the vagina. MAKE SURE YOU:  Understand these instructions.  Will watch your condition.   Will get help right away if you are not doing well or get worse.  Document Released: 05/29/2010 Document Revised: 11/22/2012 Document Reviewed: 08/31/2012 [facility] Patient Information 2015 [place], [place]. This information is not intended to replace advice given to you by your health care provider. Make sure you discuss any questions you have with your health care provider.  Fetal Movement Counts Patient Name: __________________________________________________ Patient Due Date: ____________________ Performing a fetal movement count is highly recommended in high-risk pregnancies, but it is good for every pregnant woman to do. Your health care provider may ask you to start counting fetal movements at 28 weeks of the pregnancy. Fetal movements  often increase:  After eating a full meal.  After physical activity.  After eating or drinking something sweet or cold.  At rest. Pay attention to when you feel the baby is most active. This will help you notice a pattern of your baby's sleep and wake cycles and what factors contribute to an increase in fetal movement. It is important to perform a fetal movement count at the same time each day when your baby is normally most active.  HOW TO COUNT FETAL MOVEMENTS 1. Find a quiet and comfortable area to sit or lie down on your left side. Lying on your left side provides the best blood and oxygen circulation to your baby. 2. Write down the day and time on a sheet of paper or in a journal. 3. Start counting kicks, flutters, swishes, rolls, or jabs in a 2-hour period. You should feel at least 10 movements within 2 hours. 4. If you do not feel 10 movements in 2 hours, wait 2-3 hours and count again. Look for a change in the pattern or not enough counts in 2 hours. SEEK MEDICAL CARE IF:  You feel less than 10 counts in 2 hours, tried twice.  There is no movement in over an hour.  The pattern is changing or taking longer each day to reach 10 counts in 2 hours.  You feel the baby is not moving as he or she usually does. Date: ____________ Movements: ____________ Start time: ____________ [name] MartinFinish time: ____________  Date: ____________ Movements: ____________ Start time: ____________ [name] MartinFinish time: ____________ Date: ____________ Movements: ____________ Start time: ____________ [name] MartinFinish time: ____________ Date: ____________ Movements: ____________ Start time: ____________ [name] MartinFinish time: ____________ Date: ____________ Movements: ____________ Start time: ____________ [name] MartinFinish time: ____________ Date: ____________ Movements: ____________ Start time: ____________ [name] MartinFinish time: ____________ Date: ____________ Movements: ____________ Start time: ____________ [name] MartinFinish time: ____________ Date: ____________  Movements: ____________ Start time: ____________ [name] MartinFinish time: ____________  Date: ____________ Movements: ____________ Start time: ____________ [name] MartinFinish time: ____________ Date: ____________ Movements: ____________ Start time: ____________ [name] MartinFinish time: ____________ Date: ____________ Movements: ____________ Start time: ____________ [name] MartinFinish time: ____________ Date: ____________ Movements: ____________ Start time: ____________ [name] MartinFinish time: ____________ Date: ____________ Movements: ____________ Start time: ____________ [name] MartinFinish time: ____________ Date: ____________ Movements: ____________ Start time: ____________ [name] MartinFinish time: ____________ Date: ____________ Movements: ____________ Start time: ____________ [name] MartinFinish time: ____________  Date: ____________ Movements: ____________ Start time: ____________ [name] MartinFinish time: ____________ Date: ____________ Movements: ____________ Start time: ____________ [name] MartinFinish time: ____________ Date: ____________ Movements: ____________ Start time: ____________ [name] MartinFinish time: ____________ Date: ____________ Movements: ____________ Start time: ____________ [name] MartinFinish time: ____________ Date: ____________ Movements: ____________ Start time: ____________ [name] MartinFinish time: ____________ Date: ____________ Movements: ____________ Start time: ____________ [name] MartinFinish time: ____________ Date: ____________ Movements: ____________ Start time: ____________ [name] MartinFinish time: ____________  Date: ____________ Movements: ____________ Start time: ____________ [name] MartinFinish time: ____________ Date: ____________ Movements:  ____________ Start time: ____________ [name] [name] time: ____________ Date: ____________ Movements: ____________ Start time: ____________ [name] [name] time: ____________ Date: ____________ Movements: ____________ Start time: ____________ [name] [name] time: ____________ Date: ____________ Movements: ____________ Start time: ____________ [name] [name] time: ____________ Date: ____________ Movements: ____________ Start time:  ____________ [name] [name] time: ____________ Date: ____________ Movements: ____________ Start time: ____________ [name] [name] time: ____________  Date: ____________ Movements: ____________ Start time: ____________ [name] [name] time: ____________ Date: ____________ Movements: ____________ Start time: ____________ [name] [name] time: ____________ Date: ____________ Movements: ____________ Start time: ____________ [name] [name] time: ____________ Date: ____________ Movements: ____________ Start time: ____________ [name] [name] time: ____________ Date: ____________ Movements: ____________ Start time: ____________ [name] [name] time: ____________ Date: ____________ Movements: ____________ Start time: ____________ [name] [name] time: ____________ Date: ____________ Movements: ____________ Start time: ____________ [name] [name] time: ____________  Date: ____________ Movements: ____________ Start time: ____________ [name] [name] time: ____________ Date: ____________ Movements: ____________ Start time: ____________ [name] [name] time: ____________ Date: ____________ Movements: ____________ Start time: ____________ [name] [name] time: ____________ Date: ____________ Movements: ____________ Start time: ____________ [name] [name] time: ____________ Date: ____________ Movements: ____________ Start time: ____________ [name] [name] time: ____________ Date: ____________ Movements: ____________ Start time: ____________ [name] [name] time: ____________ Date: ____________ Movements: ____________ Start time: ____________ [name] [name] time: ____________  Date: ____________ Movements: ____________ Start time: ____________ [name] [name] time: ____________ Date: ____________ Movements: ____________ Start time: ____________ [name] [name] time: ____________ Date: ____________ Movements: ____________ Start time: ____________ [name] [name] time: ____________ Date: ____________ Movements: ____________ Start time: ____________ [name] [name] time: ____________ Date: ____________ Movements: ____________ Start time: ____________ [name] [name] time: ____________ Date:  ____________ Movements: ____________ Start time: ____________ [name] [name] time: ____________ Date: ____________ Movements: ____________ Start time: ____________ [name] [name] time: ____________  Date: ____________ Movements: ____________ Start time: ____________ [name] [name] time: ____________ Date: ____________ Movements: ____________ Start time: ____________ [name] [name] time: ____________ Date: ____________ Movements: ____________ Start time: ____________ [name] [name] time: ____________ Date: ____________ Movements: ____________ Start time: ____________ [name] [name] time: ____________ Date: ____________ Movements: ____________ Start time: ____________ [name] [name] time: ____________ Date: ____________ Movements: ____________ Start time: ____________ [name] [name] time: ____________ Document Released: 03/03/2006 Document Revised: 06/18/2013 Document Reviewed: 11/29/2011 [employer] Patient Information 2015 [employer]. This information is not intended to replace advice given to you by your health care provider. Make sure you discuss any questions you have with your health care provider.

## 2013-09-25 NOTE — MAU Note (Signed)
Began feeling pelvic pressure @ 0200, then had spotting, then started having uc's which are now more intense.  No FM today.  Denies bleeding now.

## 2013-09-25 NOTE — MAU Provider Note (Signed)
Chief Complaint:  pelvic pressure  and Decreased Fetal Movement   First Provider Initiated Contact with Patient 09/25/13 1305      HPI: [name] [name] is a 18 y.o. [id number] at 5067w1d who presents to maternity admissions reporting contractions and 1 episode of spotting since 0200. Also reports no FM today. Denies fever, chills, leakage of fluid urinary complaints or GI complaints. Last IC last night.   B pos. Posterior placenta per anatomy scan at office.   Pregnancy Course: Hx C/S x 2. 2012 PROM, abruption at 36+ weeks.   Past Medical History: Past Medical History  Diagnosis Date  . Anemia   . Preterm labor   . Infection   . 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 Headache(784.0)     otc med prn    Past obstetric history: OB History  Gravida Para Term Preterm AB SAB TAB Ectopic Multiple Living  5 2 1 1 2 2  0 0 0 2    # Outcome Date GA Lbr Len/2nd Weight Sex Delivery Anes PTL Lv  5 CUR           4 TRM 05/16/12 3220w4d  7 lb 8.8 oz (3.425 kg) M LTCS   Y  3 SAB 09/2010 2787w0d         2 PRE 04/2010 73387w0d  3 lb 14 oz (1.758 kg) F LTCS EPI Y Y     Comments: placental abruption at 36wk 4days  1 SAB               Past Surgical History: Past Surgical History  Procedure Laterality Date  . Cesarean section    . Cesarean section N/A 05/16/2012    Procedure: CESAREAN SECTION;  Surgeon: Bing Plumehomas F Henley, MD;  Location: [facility];  Service: Obstetrics;  Laterality: N/A;  repeat      Family History: Family History  Problem Relation Age of Onset  . Alcohol abuse Mother   . Diabetes Maternal Grandmother   . Heart disease Maternal Grandmother   . Hyperlipidemia Maternal Grandmother   . Hypertension Maternal Grandmother   . Cancer Maternal Grandmother   . COPD Maternal Grandmother   . Arthritis Maternal Grandmother   . Alcohol abuse Maternal Grandfather     Social History: History  Substance Use Topics  . Smoking status: Current Every Day Smoker -- 0.50 packs/day for 7  years    Types: Cigarettes  . Smokeless tobacco: Never Used  . Alcohol Use: No    Allergies: No Known Allergies  Meds:  Prescriptions prior to admission  Medication Sig Dispense Refill  . acetaminophen (TYLENOL) 325 MG tablet Take 2 tablets (650 mg total) by mouth every 6 (six) hours as needed for mild pain or moderate pain.  30 tablet  0  . nitrofurantoin, macrocrystal-monohydrate, (MACROBID) 100 MG capsule Take 1 capsule (100 mg total) by mouth 2 (two) times daily.  14 capsule  0    ROS: Pertinent findings in history of present illness.  Physical Exam  Blood pressure 126/72, pulse 102, temperature 98.2 F (36.8 C), temperature source Oral, resp. rate 20, last menstrual period 01/08/2013, not currently breastfeeding. GENERAL: Well-developed, well-nourished female in no acute distress.  HEENT: normocephalic HEART: normal rate RESP: normal effort ABDOMEN: Soft, non-tender, gravid appropriate for gestational age.Palpates soft in btw UC's EXTREMITIES: Nontender, no edema NEURO: alert and oriented SPECULUM EXAM: NEFG, scant physiologic discharge, no blood, cervix non-friable, ? nabothian cysts.  Dilation: 1 Effacement (%):  50 Cervical Position: Middle Station: Ballotable Presentation: Vertex Exam by:: Dorathy Kinsman, CNM  FHT:  Baseline 135 , moderate variability, accelerations present, no decelerations Contractions: q 3-8 mins, mild   Labs: Results for orders placed during the hospital encounter of 09/25/13 (from the past 24 hour(s))  URINALYSIS, ROUTINE W REFLEX MICROSCOPIC     Status: Abnormal   Collection Time    09/25/13 12:10 PM      Result Value Ref Range   Color, Urine YELLOW  YELLOW   APPearance CLEAR  CLEAR   Specific Gravity, Urine <1.005 (*) 1.005 - 1.030   pH 6.5  5.0 - 8.0   Glucose, UA NEGATIVE  NEGATIVE mg/dL   Hgb urine dipstick TRACE (*) NEGATIVE   Bilirubin Urine NEGATIVE  NEGATIVE   Ketones, ur NEGATIVE  NEGATIVE mg/dL   Protein, ur NEGATIVE   NEGATIVE mg/dL   Urobilinogen, UA 0.2  0.0 - 1.0 mg/dL   Nitrite NEGATIVE  NEGATIVE   Leukocytes, UA MODERATE (*) NEGATIVE  URINE MICROSCOPIC-ADD ON     Status: None   Collection Time    09/25/13 12:10 PM      Result Value Ref Range   Squamous Epithelial / LPF RARE  RARE   WBC, UA 11-20  <3 WBC/hpf   Bacteria, UA RARE  RARE  WET PREP, GENITAL     Status: Abnormal   Collection Time    09/25/13  1:24 PM      Result Value Ref Range   Yeast Wet Prep HPF POC NONE SEEN  NONE SEEN   Trich, Wet Prep NONE SEEN  NONE SEEN   Clue Cells Wet Prep HPF POC NONE SEEN  NONE SEEN   WBC, Wet Prep HPF POC FEW (*) NONE SEEN    Imaging:  No results found.  MAU Course: Push clear fluids, UA.   No cervical change after 2 hours of observation. Pt asleep, unaware of UC's when CNM returned to room.  Assessment: 1. Premature uterine contractions causing threatened premature labor, unspecified trimester   2. UTI in pregnancy, third trimester   3. Decreased fetal movement, third trimester, fetus 1   4. PCB (post coital bleeding)    Plan: Discharge home in stable condition pr consult w/ Dr. Ellyn Hack.  Preterm labor precautions and fetal kick counts Pelvic rest x 1 week. Urine culture, GC/Ct cultures pending.  Reschedule office visit. Bleeding precautions.  Follow-up Information   Follow up with Bing Plume, MD. Schedule an appointment as soon as possible for a visit in 1 week.   Specialty:  Obstetrics and Gynecology   Contact information:   [address], [address] [place] [address] [phone]       Follow up with [facility] MATERNITY ADMISSIONS. (As needed in emergencies)    Contact information:   [address] [id number] [place] [address] [phone]        Medication List         nitrofurantoin (macrocrystal-monohydrate) 100 MG capsule  Commonly known as:  MACROBID  Take 1 capsule (100 mg total) by mouth 2 (two) times  daily.        [place], CNM 09/25/2013 3:52 PM

## 2013-09-25 NOTE — MAU Provider Note (Signed)
Attestation of Attending Supervision of Advanced Practitioner (PA/CNM/NP): Evaluation and management procedures were performed by the Advanced Practitioner under my supervision and collaboration.  I have reviewed the Advanced Practitioner's note and chart, and I agree with the management and plan.  Kyriaki Moder, MD, FACOG Attending Obstetrician & Gynecologist Faculty Practice, [facility]   

## 2013-09-26 LAB — GC/CHLAMYDIA PROBE AMP
CT Probe RNA: NEGATIVE
GC Probe RNA: NEGATIVE

## 2013-09-27 LAB — CULTURE, OB URINE
Colony Count: >=100000
Special Requests: NORMAL

## 2013-10-24 ENCOUNTER — Encounter (HOSPITAL_COMMUNITY): Payer: Self-pay | Admitting: Pharmacist

## 2013-10-28 ENCOUNTER — Encounter (HOSPITAL_COMMUNITY): Payer: Self-pay | Admitting: *Deleted

## 2013-10-28 ENCOUNTER — Inpatient Hospital Stay (HOSPITAL_COMMUNITY)
Admission: AD | Admit: 2013-10-28 | Discharge: 2013-10-28 | Disposition: A | Discharge status: Home / Self Care | Payer: Medicaid Other | Source: Ambulatory Visit | Attending: Obstetrics and Gynecology | Admitting: Obstetrics and Gynecology

## 2013-10-28 DIAGNOSIS — O34219 Maternal care for unspecified type scar from previous cesarean delivery: Principal | ICD-10-CM | POA: Diagnosis present

## 2013-10-28 DIAGNOSIS — O99334 Smoking (tobacco) complicating childbirth: Secondary | ICD-10-CM | POA: Diagnosis present

## 2013-10-28 DIAGNOSIS — E876 Hypokalemia: Secondary | ICD-10-CM | POA: Diagnosis not present

## 2013-10-28 DIAGNOSIS — Z8614 Personal history of Methicillin resistant Staphylococcus aureus infection: Secondary | ICD-10-CM

## 2013-10-28 DIAGNOSIS — O411090 Infection of amniotic sac and membranes, unspecified, unspecified trimester, not applicable or unspecified: Secondary | ICD-10-CM | POA: Diagnosis present

## 2013-10-28 LAB — URINALYSIS, ROUTINE W REFLEX MICROSCOPIC
Bilirubin Urine: NEGATIVE
GLUCOSE, UA: NEGATIVE mg/dL
KETONES UR: NEGATIVE mg/dL
Nitrite: NEGATIVE
PROTEIN: NEGATIVE mg/dL
Specific Gravity, Urine: <1.005 — ABNORMAL LOW (ref 1.005–1.030)
Urobilinogen, UA: 0.2 mg/dL (ref 0.0–1.0)
pH: 7 (ref 5.0–8.0)

## 2013-10-28 LAB — POCT FERN TEST: POCT Fern Test: NEGATIVE

## 2013-10-28 LAB — URINE MICROSCOPIC-ADD ON

## 2013-10-28 LAB — OB RESULTS CONSOLE GBS: GBS: POSITIVE

## 2013-10-28 NOTE — MAU Note (Addendum)
Patient presents with complaint of "red stuff running down (her) leg" at 0744 tonight.

## 2013-10-28 NOTE — Discharge Instructions (Signed)

## 2013-10-29 ENCOUNTER — Encounter (HOSPITAL_COMMUNITY)
Admission: RE | Admit: 2013-10-29 | Discharge: 2013-10-29 | Disposition: A | Discharge status: Home / Self Care | Payer: Medicaid Other | Source: Ambulatory Visit | Attending: Obstetrics and Gynecology | Admitting: Obstetrics and Gynecology

## 2013-10-29 ENCOUNTER — Encounter (HOSPITAL_COMMUNITY)
Admission: AD | Disposition: A | Discharge status: Home / Self Care | Payer: Self-pay | Source: Ambulatory Visit | Attending: Obstetrics and Gynecology

## 2013-10-29 ENCOUNTER — Encounter (HOSPITAL_COMMUNITY): Payer: Self-pay | Admitting: *Deleted

## 2013-10-29 ENCOUNTER — Inpatient Hospital Stay (HOSPITAL_COMMUNITY)
Admission: RE | Admit: 2013-10-29 | Payer: Medicaid Other | Source: Ambulatory Visit | Admitting: Obstetrics and Gynecology

## 2013-10-29 ENCOUNTER — Encounter (HOSPITAL_COMMUNITY): Payer: Medicaid Other | Admitting: Anesthesiology

## 2013-10-29 ENCOUNTER — Inpatient Hospital Stay (HOSPITAL_COMMUNITY): Payer: Medicaid Other | Admitting: Anesthesiology

## 2013-10-29 ENCOUNTER — Inpatient Hospital Stay (HOSPITAL_COMMUNITY)
Admission: AD | Admit: 2013-10-29 | Discharge: 2013-11-01 | DRG: 765 | Disposition: A | Discharge status: Home / Self Care | Payer: Medicaid Other | Source: Ambulatory Visit | Attending: Obstetrics and Gynecology | Admitting: Obstetrics and Gynecology

## 2013-10-29 ENCOUNTER — Encounter (HOSPITAL_COMMUNITY): Payer: Self-pay

## 2013-10-29 DIAGNOSIS — O99334 Smoking (tobacco) complicating childbirth: Secondary | ICD-10-CM | POA: Diagnosis present

## 2013-10-29 DIAGNOSIS — Z98891 History of uterine scar from previous surgery: Secondary | ICD-10-CM

## 2013-10-29 DIAGNOSIS — IMO0001 Reserved for inherently not codable concepts without codable children: Secondary | ICD-10-CM

## 2013-10-29 DIAGNOSIS — Z789 Other specified health status: Secondary | ICD-10-CM

## 2013-10-29 DIAGNOSIS — O479 False labor, unspecified: Secondary | ICD-10-CM | POA: Diagnosis present

## 2013-10-29 DIAGNOSIS — O411090 Infection of amniotic sac and membranes, unspecified, unspecified trimester, not applicable or unspecified: Secondary | ICD-10-CM | POA: Diagnosis present

## 2013-10-29 DIAGNOSIS — Z8614 Personal history of Methicillin resistant Staphylococcus aureus infection: Secondary | ICD-10-CM | POA: Diagnosis not present

## 2013-10-29 DIAGNOSIS — E876 Hypokalemia: Secondary | ICD-10-CM | POA: Diagnosis not present

## 2013-10-29 DIAGNOSIS — O34219 Maternal care for unspecified type scar from previous cesarean delivery: Secondary | ICD-10-CM | POA: Diagnosis present

## 2013-10-29 HISTORY — DX: Missed abortion: O02.1

## 2013-10-29 LAB — TYPE AND SCREEN
ABO/RH(D): B POS
Antibody Screen: NEGATIVE

## 2013-10-29 LAB — CBC
HCT: 33 % — ABNORMAL LOW (ref 36.0–46.0)
Hemoglobin: 11.5 g/dL — ABNORMAL LOW (ref 12.0–15.0)
MCH: 31.9 pg (ref 26.0–34.0)
MCHC: 34.8 g/dL (ref 30.0–36.0)
MCV: 91.7 fL (ref 78.0–100.0)
PLATELETS: 223 10*3/uL (ref 150–400)
RBC: 3.6 MIL/uL — ABNORMAL LOW (ref 3.87–5.11)
RDW: 12.5 % (ref 11.5–15.5)
WBC: 17.7 10*3/uL — ABNORMAL HIGH (ref 4.0–10.5)

## 2013-10-29 LAB — RPR

## 2013-10-29 SURGERY — Surgical Case
Anesthesia: Spinal | Site: Abdomen

## 2013-10-29 MED ORDER — MEPERIDINE HCL 25 MG/ML IJ SOLN: 6.2500 mg | INTRAMUSCULAR | Status: DC | PRN

## 2013-10-29 MED ORDER — DIBUCAINE 1 % RE OINT: 1.0000 "application " | TOPICAL_OINTMENT | RECTAL | Status: DC | PRN

## 2013-10-29 MED ORDER — IBUPROFEN 600 MG PO TABS
600.0000 mg | ORAL_TABLET | Freq: Four times a day (QID) | ORAL | Status: DC
  Administered 2013-10-30 – 2013-11-01 (×9): 600 mg via ORAL
  Filled 2013-10-29 (×10): qty 1

## 2013-10-29 MED ORDER — OXYTOCIN 10 UNIT/ML IJ SOLN
40.0000 [IU] | INTRAVENOUS | Status: DC | PRN
  Administered 2013-10-29: 40 [IU] via INTRAVENOUS

## 2013-10-29 MED ORDER — FENTANYL CITRATE 0.05 MG/ML IJ SOLN
INTRAMUSCULAR | Status: AC
  Filled 2013-10-29: qty 2

## 2013-10-29 MED ORDER — NALOXONE HCL 1 MG/ML IJ SOLN: 1.0000 ug/kg/h | INTRAMUSCULAR | Status: DC | PRN

## 2013-10-29 MED ORDER — LACTATED RINGERS IV SOLN
INTRAVENOUS | Status: DC | PRN
  Administered 2013-10-29: 17:00:00 via INTRAVENOUS

## 2013-10-29 MED ORDER — LACTATED RINGERS IV SOLN
INTRAVENOUS | Status: DC
  Administered 2013-10-30: 02:00:00 via INTRAVENOUS

## 2013-10-29 MED ORDER — SCOPOLAMINE 1 MG/3DAYS TD PT72
MEDICATED_PATCH | TRANSDERMAL | Status: DC | PRN
  Administered 2013-10-29: 1 via TRANSDERMAL

## 2013-10-29 MED ORDER — PHENYLEPHRINE 8 MG IN D5W 100 ML (0.08MG/ML) PREMIX OPTIME
INJECTION | INTRAVENOUS | Status: DC | PRN
  Administered 2013-10-29: 80 ug/min via INTRAVENOUS

## 2013-10-29 MED ORDER — MEASLES, MUMPS & RUBELLA VAC ~~LOC~~ INJ
0.5000 mL | INJECTION | Freq: Once | SUBCUTANEOUS | Status: AC
Start: 2013-10-30 — End: 2013-11-01
  Administered 2013-11-01: 0.5 mL via SUBCUTANEOUS
  Filled 2013-10-29: qty 0.5

## 2013-10-29 MED ORDER — CEFAZOLIN SODIUM-DEXTROSE 2-3 GM-% IV SOLR
2.0000 g | Freq: Three times a day (TID) | INTRAVENOUS | Status: AC
  Administered 2013-10-30 (×2): 2 g via INTRAVENOUS
  Filled 2013-10-29 (×2): qty 50

## 2013-10-29 MED ORDER — NALOXONE HCL 0.4 MG/ML IJ SOLN: 0.4000 mg | INTRAMUSCULAR | Status: DC | PRN

## 2013-10-29 MED ORDER — OXYCODONE-ACETAMINOPHEN 5-325 MG PO TABS
1.0000 | ORAL_TABLET | ORAL | Status: DC | PRN
  Administered 2013-10-31: 1 via ORAL
  Filled 2013-10-29: qty 1

## 2013-10-29 MED ORDER — CITRIC ACID-SODIUM CITRATE 334-500 MG/5ML PO SOLN
30.0000 mL | Freq: Once | ORAL | Status: AC
  Administered 2013-10-29: 30 mL via ORAL
  Filled 2013-10-29: qty 15

## 2013-10-29 MED ORDER — MORPHINE SULFATE (PF) 0.5 MG/ML IJ SOLN
INTRAMUSCULAR | Status: DC | PRN
  Administered 2013-10-29: .2 mg via INTRATHECAL

## 2013-10-29 MED ORDER — DIPHENHYDRAMINE HCL 50 MG/ML IJ SOLN: 25.0000 mg | INTRAMUSCULAR | Status: DC | PRN

## 2013-10-29 MED ORDER — FENTANYL CITRATE 0.05 MG/ML IJ SOLN
INTRAMUSCULAR | Status: DC | PRN
  Administered 2013-10-29: 10 ug via INTRATHECAL

## 2013-10-29 MED ORDER — SENNOSIDES-DOCUSATE SODIUM 8.6-50 MG PO TABS
2.0000 | ORAL_TABLET | ORAL | Status: DC
  Administered 2013-10-29 – 2013-11-01 (×3): 2 via ORAL
  Filled 2013-10-29 (×3): qty 2

## 2013-10-29 MED ORDER — METOCLOPRAMIDE HCL 5 MG/ML IJ SOLN: 10.0000 mg | Freq: Three times a day (TID) | INTRAMUSCULAR | Status: DC | PRN

## 2013-10-29 MED ORDER — MEPERIDINE HCL 25 MG/ML IJ SOLN
INTRAMUSCULAR | Status: AC
  Filled 2013-10-29: qty 1

## 2013-10-29 MED ORDER — MORPHINE SULFATE 0.5 MG/ML IJ SOLN
INTRAMUSCULAR | Status: AC
  Filled 2013-10-29: qty 10

## 2013-10-29 MED ORDER — KETOROLAC TROMETHAMINE 30 MG/ML IJ SOLN: 15.0000 mg | Freq: Once | INTRAMUSCULAR | Status: DC | PRN

## 2013-10-29 MED ORDER — OXYTOCIN 10 UNIT/ML IJ SOLN
INTRAMUSCULAR | Status: AC
  Filled 2013-10-29: qty 4

## 2013-10-29 MED ORDER — HYDROMORPHONE HCL PF 1 MG/ML IJ SOLN
INTRAMUSCULAR | Status: AC
  Administered 2013-10-29: 0.5 mg via INTRAVENOUS
  Filled 2013-10-29: qty 1

## 2013-10-29 MED ORDER — LANOLIN HYDROUS EX OINT: 1.0000 "application " | TOPICAL_OINTMENT | CUTANEOUS | Status: DC | PRN

## 2013-10-29 MED ORDER — DIPHENHYDRAMINE HCL 25 MG PO CAPS: 25.0000 mg | ORAL_CAPSULE | Freq: Four times a day (QID) | ORAL | Status: DC | PRN

## 2013-10-29 MED ORDER — CEFAZOLIN SODIUM-DEXTROSE 2-3 GM-% IV SOLR
2.0000 g | Freq: Once | INTRAVENOUS | Status: AC
  Administered 2013-10-29: 2 g via INTRAVENOUS
  Filled 2013-10-29: qty 50

## 2013-10-29 MED ORDER — WITCH HAZEL-GLYCERIN EX PADS: 1.0000 "application " | MEDICATED_PAD | CUTANEOUS | Status: DC | PRN

## 2013-10-29 MED ORDER — DIPHENHYDRAMINE HCL 50 MG/ML IJ SOLN: 12.5000 mg | INTRAMUSCULAR | Status: DC | PRN

## 2013-10-29 MED ORDER — SCOPOLAMINE 1 MG/3DAYS TD PT72
MEDICATED_PATCH | TRANSDERMAL | Status: AC
  Filled 2013-10-29: qty 1

## 2013-10-29 MED ORDER — HYDROMORPHONE HCL PF 1 MG/ML IJ SOLN
0.2500 mg | INTRAMUSCULAR | Status: DC | PRN
  Administered 2013-10-29 (×2): 0.5 mg via INTRAVENOUS

## 2013-10-29 MED ORDER — ONDANSETRON HCL 4 MG/2ML IJ SOLN
4.0000 mg | Freq: Three times a day (TID) | INTRAMUSCULAR | Status: DC | PRN
Start: 2013-10-29 — End: 2013-11-01

## 2013-10-29 MED ORDER — ZOLPIDEM TARTRATE 5 MG PO TABS: 5.0000 mg | ORAL_TABLET | Freq: Every evening | ORAL | Status: DC | PRN

## 2013-10-29 MED ORDER — ONDANSETRON HCL 4 MG PO TABS: 4.0000 mg | ORAL_TABLET | ORAL | Status: DC | PRN

## 2013-10-29 MED ORDER — NALBUPHINE HCL 10 MG/ML IJ SOLN: 5.0000 mg | INTRAMUSCULAR | Status: DC | PRN

## 2013-10-29 MED ORDER — FAMOTIDINE IN NACL 20-0.9 MG/50ML-% IV SOLN
20.0000 mg | Freq: Once | INTRAVENOUS | Status: AC
  Administered 2013-10-29: 20 mg via INTRAVENOUS
  Filled 2013-10-29: qty 50

## 2013-10-29 MED ORDER — SIMETHICONE 80 MG PO CHEW
80.0000 mg | CHEWABLE_TABLET | ORAL | Status: DC
  Administered 2013-10-29 – 2013-11-01 (×3): 80 mg via ORAL
  Filled 2013-10-29 (×3): qty 1

## 2013-10-29 MED ORDER — SODIUM CHLORIDE 0.9 % IJ SOLN: 3.0000 mL | INTRAMUSCULAR | Status: DC | PRN

## 2013-10-29 MED ORDER — IBUPROFEN 600 MG PO TABS: 600.0000 mg | ORAL_TABLET | Freq: Four times a day (QID) | ORAL | Status: DC | PRN

## 2013-10-29 MED ORDER — KETOROLAC TROMETHAMINE 30 MG/ML IJ SOLN
30.0000 mg | Freq: Four times a day (QID) | INTRAMUSCULAR | Status: AC | PRN
  Administered 2013-10-30: 30 mg via INTRAVENOUS
  Filled 2013-10-29: qty 1

## 2013-10-29 MED ORDER — SIMETHICONE 80 MG PO CHEW
80.0000 mg | CHEWABLE_TABLET | Freq: Three times a day (TID) | ORAL | Status: DC
  Administered 2013-10-30 – 2013-11-01 (×5): 80 mg via ORAL
  Filled 2013-10-29 (×6): qty 1

## 2013-10-29 MED ORDER — 0.9 % SODIUM CHLORIDE (POUR BTL) OPTIME
TOPICAL | Status: DC | PRN
  Administered 2013-10-29: 1000 mL

## 2013-10-29 MED ORDER — KETOROLAC TROMETHAMINE 30 MG/ML IJ SOLN
INTRAMUSCULAR | Status: AC
  Administered 2013-10-29: 30 mg via INTRAMUSCULAR
  Filled 2013-10-29: qty 1

## 2013-10-29 MED ORDER — MENTHOL 3 MG MT LOZG: 1.0000 | LOZENGE | OROMUCOSAL | Status: DC | PRN

## 2013-10-29 MED ORDER — KETOROLAC TROMETHAMINE 30 MG/ML IJ SOLN
30.0000 mg | Freq: Four times a day (QID) | INTRAMUSCULAR | Status: AC | PRN
  Administered 2013-10-29: 30 mg via INTRAMUSCULAR

## 2013-10-29 MED ORDER — PRENATAL MULTIVITAMIN CH: 1.0000 | ORAL_TABLET | Freq: Every day | ORAL | Status: DC

## 2013-10-29 MED ORDER — MEPERIDINE HCL 25 MG/ML IJ SOLN
INTRAMUSCULAR | Status: DC | PRN
  Administered 2013-10-29: 12.5 mg via INTRAVENOUS

## 2013-10-29 MED ORDER — DIPHENHYDRAMINE HCL 25 MG PO CAPS: 25.0000 mg | ORAL_CAPSULE | ORAL | Status: DC | PRN

## 2013-10-29 MED ORDER — LACTATED RINGERS IV SOLN
INTRAVENOUS | Status: DC | PRN
  Administered 2013-10-29: 16:00:00 via INTRAVENOUS

## 2013-10-29 MED ORDER — BUPIVACAINE IN DEXTROSE 0.75-8.25 % IT SOLN
INTRATHECAL | Status: DC | PRN
  Administered 2013-10-29: 10.5 mg via INTRATHECAL

## 2013-10-29 MED ORDER — PRENATAL MULTIVITAMIN CH
1.0000 | ORAL_TABLET | Freq: Every day | ORAL | Status: DC
  Administered 2013-10-30 – 2013-10-31 (×2): 1 via ORAL
  Filled 2013-10-29 (×2): qty 1

## 2013-10-29 MED ORDER — PROMETHAZINE HCL 25 MG/ML IJ SOLN: 6.2500 mg | INTRAMUSCULAR | Status: DC | PRN

## 2013-10-29 MED ORDER — OXYCODONE-ACETAMINOPHEN 5-325 MG PO TABS
2.0000 | ORAL_TABLET | ORAL | Status: DC | PRN
  Administered 2013-10-30 – 2013-11-01 (×10): 2 via ORAL
  Filled 2013-10-29 (×10): qty 2

## 2013-10-29 MED ORDER — FENTANYL CITRATE 0.05 MG/ML IJ SOLN
INTRAMUSCULAR | Status: DC | PRN
  Administered 2013-10-29: 90 ug via INTRAVENOUS

## 2013-10-29 MED ORDER — ONDANSETRON HCL 4 MG/2ML IJ SOLN
INTRAMUSCULAR | Status: DC | PRN
  Administered 2013-10-29: 4 mg via INTRAVENOUS

## 2013-10-29 MED ORDER — NALBUPHINE HCL 10 MG/ML IJ SOLN
5.0000 mg | INTRAMUSCULAR | Status: DC | PRN
  Administered 2013-10-29 (×2): 5 mg via INTRAVENOUS
  Filled 2013-10-29: qty 1

## 2013-10-29 MED ORDER — CEFAZOLIN SODIUM-DEXTROSE 2-3 GM-% IV SOLR
INTRAVENOUS | Status: AC
  Filled 2013-10-29: qty 50

## 2013-10-29 MED ORDER — ONDANSETRON HCL 4 MG/2ML IJ SOLN
4.0000 mg | INTRAMUSCULAR | Status: DC | PRN
Start: 2013-10-29 — End: 2013-11-01

## 2013-10-29 MED ORDER — SIMETHICONE 80 MG PO CHEW: 80.0000 mg | CHEWABLE_TABLET | ORAL | Status: DC | PRN

## 2013-10-29 MED ORDER — PHENYLEPHRINE HCL 10 MG/ML IJ SOLN
INTRAMUSCULAR | Status: AC
  Filled 2013-10-29: qty 1

## 2013-10-29 MED ORDER — OXYTOCIN 40 UNITS IN LACTATED RINGERS INFUSION - SIMPLE MED: 62.5000 mL/h | INTRAVENOUS | Status: AC

## 2013-10-29 MED ORDER — SCOPOLAMINE 1 MG/3DAYS TD PT72: 1.0000 | MEDICATED_PATCH | Freq: Once | TRANSDERMAL | Status: DC

## 2013-10-29 MED ORDER — ONDANSETRON HCL 4 MG/2ML IJ SOLN
INTRAMUSCULAR | Status: AC
  Filled 2013-10-29: qty 2

## 2013-10-29 MED ORDER — TETANUS-DIPHTH-ACELL PERTUSSIS 5-2.5-18.5 LF-MCG/0.5 IM SUSP: 0.5000 mL | Freq: Once | INTRAMUSCULAR | Status: DC

## 2013-10-29 SURGICAL SUPPLY — 38 items
BLADE SURG 10 STRL SS (BLADE) ×6 IMPLANT
CLAMP CORD UMBIL (MISCELLANEOUS) IMPLANT
CLOTH BEACON ORANGE TIMEOUT ST (SAFETY) ×3 IMPLANT
CONTAINER PREFILL 10% NBF 15ML (MISCELLANEOUS) IMPLANT
DRAPE LG THREE QUARTER DISP (DRAPES) IMPLANT
DRSG OPSITE POSTOP 4X10 (GAUZE/BANDAGES/DRESSINGS) ×3 IMPLANT
DRSG VASELINE 3X18 (GAUZE/BANDAGES/DRESSINGS) ×3 IMPLANT
DURAPREP 26ML APPLICATOR (WOUND CARE) ×3 IMPLANT
ELECT REM PT RETURN 9FT ADLT (ELECTROSURGICAL) ×3
ELECTRODE REM PT RTRN 9FT ADLT (ELECTROSURGICAL) ×1 IMPLANT
EXTRACTOR VACUUM KIWI (MISCELLANEOUS) IMPLANT
EXTRACTOR VACUUM M CUP 4 TUBE (SUCTIONS) IMPLANT
EXTRACTOR VACUUM M CUP 4' TUBE (SUCTIONS)
GAUZE VASELINE 3X9 (GAUZE/BANDAGES/DRESSINGS) ×2 IMPLANT
GLOVE BIO SURGEON STRL SZ 6.5 (GLOVE) ×1 IMPLANT
GLOVE BIO SURGEON STRL SZ7.5 (GLOVE) ×3 IMPLANT
GLOVE BIO SURGEONS STRL SZ 6.5 (GLOVE) ×1
GOWN STRL REUS W/TWL LRG LVL3 (GOWN DISPOSABLE) ×8 IMPLANT
KIT ABG SYR 3ML LUER SLIP (SYRINGE) IMPLANT
NDL HYPO 25X5/8 SAFETYGLIDE (NEEDLE) IMPLANT
NEEDLE HYPO 25X5/8 SAFETYGLIDE (NEEDLE) IMPLANT
NS IRRIG 1000ML POUR BTL (IV SOLUTION) ×3 IMPLANT
PACK C SECTION WH (CUSTOM PROCEDURE TRAY) ×3 IMPLANT
PAD ABD 8X7 1/2 STERILE (GAUZE/BANDAGES/DRESSINGS) ×2 IMPLANT
PAD OB MATERNITY 4.3X12.25 (PERSONAL CARE ITEMS) ×3 IMPLANT
RTRCTR C-SECT PINK 25CM LRG (MISCELLANEOUS) IMPLANT
SPONGE GAUZE 4X4 12PLY STER LF (GAUZE/BANDAGES/DRESSINGS) ×2 IMPLANT
STAPLER VISISTAT 35W (STAPLE) ×2 IMPLANT
SUT PLAIN 0 NONE (SUTURE) IMPLANT
SUT VIC AB 0 CT1 36 (SUTURE) ×24 IMPLANT
SUT VIC AB 3-0 CTX 36 (SUTURE) ×3 IMPLANT
SUT VIC AB 3-0 SH 27 (SUTURE)
SUT VIC AB 3-0 SH 27X BRD (SUTURE) IMPLANT
SUT VIC AB 4-0 KS 27 (SUTURE) IMPLANT
SUT VICRYL 0 TIES 12 18 (SUTURE) IMPLANT
TOWEL OR 17X24 6PK STRL BLUE (TOWEL DISPOSABLE) ×3 IMPLANT
TRAY FOLEY CATH 14FR (SET/KITS/TRAYS/PACK) ×3 IMPLANT
WATER STERILE IRR 1000ML POUR (IV SOLUTION) ×1 IMPLANT

## 2013-10-29 NOTE — Anesthesia Procedure Notes (Signed)
Spinal  Patient location during procedure: OR Start time: 10/29/2013 4:24 PM End time: 10/29/2013 4:27 PM Staffing Anesthesiologist: Leilani Able Performed by: anesthesiologist  Preanesthetic Checklist Completed: patient identified, surgical consent, pre-op evaluation, timeout performed, IV checked, risks and benefits discussed and monitors and equipment checked Spinal Block Patient position: sitting Prep: site prepped and draped and DuraPrep Patient monitoring: heart rate, cardiac monitor, continuous pulse ox and blood pressure Approach: midline Location: L3-4 Injection technique: single-shot Needle Needle type: Pencan  Needle gauge: 24 G Needle length: 9 cm Needle insertion depth: 7 cm Assessment Sensory level: T4

## 2013-10-29 NOTE — Progress Notes (Signed)
Patient ID: [name] [name], female   DOB: 1995-11-04, 18 y.o.   MRN: [id number] Op note:  LTC c section for prior c section x 2 and active labor. Fluid smelled foul at delivery so chorioamnionitis suspected Spinal anesthesia OP: [name] [name] ASST Richardson EBL 800 cc's.

## 2013-10-29 NOTE — H&P (Signed)
NAMEJENNIE, [name]                  ACCOUNT NO.:  [id number]  MEDICAL RECORD NO.:  [id number]  LOCATION:  [facility]                          FACILITY:  [facility]  PHYSICIAN:  Malachi Pro. Ambrose Mantle, M.D. DATE OF BIRTH:  [birth year]  DATE OF ADMISSION:  10/29/2013 DATE OF DISCHARGE:                             HISTORY & PHYSICAL   HISTORY OF PRESENT ILLNESS:  This is a 18 year old white female, para 2- 1-1-2, gravida 4 with [facility] November 06, 2013, admitted in labor with 2 prior C-sections for repeat C-section.  Blood group and type B positive, negative antibody, RPR negative, urine culture positive, treated with Macrobid, hepatitis B surface antigen negative, HIV negative, GC and chlamydia negative, rubella not immune.  The patient began her prenatal course at our office at [redacted] weeks gestation.  Ultrasound on January 31st with 6 weeks 4 days, [facility] September 21.  The patient actually had her first prenatal visit on May 21, 2013.  She was noncompliant during her prenatal course.  She was seen on October 01, 2013, and has not been seen in the office since that time until she came today at our request for preop exam.  She was having contractions and thought she was leaking fluid.  I checked fern test and it was negative, but cervix was 2+ cm. She was sent to the hospital.  She has progressed to 4 cm and now she is prepared for a C-section.  PAST MEDICAL HISTORY:  Reveals a history of psoriasis at age 76.  Had a history of MRSA at age 24.  She has had headaches.  She had hypokalemia with a pregnancy.  SOCIAL HISTORY:  She smokes every day about a pack a day.  Does not drink.  Used marijuana prior to pregnancy.  States she has a 9th grade education.  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  Father of the baby is [name] [name].  ALLERGIES:  Morphine causes respiratory distress.  __________ causes difficulty breathing.  No latex allergy.  No food allergy.  SURGICAL HISTORY:  Two C-sections, teeth extraction at age  34, and finger surgery in the 8th grade.  PHYSICAL EXAMINATION:  GENERAL: Today showed a well-developed white female, having contractions. VITAL SIGNS:  Blood pressure 138/90, pulse of 120. Weight of 184 pounds. HEART:  Normal size and sounds except for tachycardia. LUNGS:  Clear to auscultation. PELVIC:  Fundal height 37 cm.  Fetal heart tones 144 and cervix 2+ cm, but later found to be 4 in the MAU.  ADMITTING IMPRESSION:  Intrauterine pregnancy at 38 weeks and 6 days with 2 prior C-sections.  Active labor.  The patient is prepared for repeat C-section.     Malachi Pro. Ambrose Mantle, M.D.     TFH/MEDQ  D:  10/29/2013  T:  10/29/2013  Job:  [id number]

## 2013-10-29 NOTE — Anesthesia Preprocedure Evaluation (Addendum)
Anesthesia Evaluation  Patient identified by MRN, date of birth, ID band Patient awake    Reviewed: Allergy & Precautions, H&P , NPO status , Patient's Chart, lab work & pertinent test results  Airway Mallampati: II TM Distance: >3 FB Neck ROM: full    Dental no notable dental hx. (+) Teeth Intact   Pulmonary Current Smoker,    Pulmonary exam normal       Cardiovascular negative cardio ROS      Neuro/Psych negative psych ROS   GI/Hepatic negative GI ROS, Neg liver ROS,   Endo/Other  negative endocrine ROS  Renal/GU negative Renal ROS  negative genitourinary   Musculoskeletal negative musculoskeletal ROS (+)   Abdominal Normal abdominal exam  (+)   Peds negative pediatric ROS (+)  Hematology   Anesthesia Other Findings   Reproductive/Obstetrics (+) Pregnancy                          Anesthesia Physical  Anesthesia Plan  ASA: II and emergent  Anesthesia Plan: Spinal   Post-op Pain Management:    Induction:   Airway Management Planned:   Additional Equipment:   Intra-op Plan:   Post-operative Plan:   Informed Consent: I have reviewed the patients History and Physical, chart, labs and discussed the procedure including the risks, benefits and alternatives for the proposed anesthesia with the patient or authorized representative who has indicated his/her understanding and acceptance.     Plan Discussed with: CRNA and Surgeon  Anesthesia Plan Comments:        Anesthesia Quick Evaluation

## 2013-10-29 NOTE — Anesthesia Postprocedure Evaluation (Signed)
Anesthesia Post Note  Patient: [name] [name]  Procedure(s) Performed: Procedure(s) (LRB): CESAREAN SECTION (N/A)  Anesthesia type: Spinal  Patient location: PACU  Post pain: Pain level controlled  Post assessment: Post-op Vital signs reviewed  Last Vitals:  Filed Vitals:   10/29/13 1745  BP: 111/69  Pulse: 114  Temp:   Resp: 24    Post vital signs: Reviewed  Level of consciousness: awake  Complications: No apparent anesthesia complications

## 2013-10-29 NOTE — Op Note (Signed)
[name] [name], [name] [name]                  ACCOUNT NO.:  [id number]  MEDICAL RECORD NO.:  [id number]  LOCATION:  [facility]                          [facility]ACILITY:  [facility]  PHYSICIAN:  Malachi Pro. Ambrose Mantle, M.D. DATE OF BIRTH:  10-09-1995  DATE OF PROCEDURE:  10/29/2013 DATE OF DISCHARGE:                              OPERATIVE REPORT   PREOPERATIVE DIAGNOSES:  Intrauterine pregnancy at 38 weeks and 6 days, prior C-section x2, active labor.  POSTOPERATIVE DIAGNOSES:  Intrauterine pregnancy at 38 weeks and 6 days, prior C-section x2, active labor, with probable chorioamnionitis.  OPERATION:  Repeat low-transverse cervical C-section.  OPERATOR:  Malachi Pro. Ambrose Mantle, M.D.  Threasa HeadsSenaida Ores.  ANESTHESIA:  Spinal anesthesia, Dr. Arby Barrette.  DESCRIPTION OF PROCEDURE:  The patient was brought to the operating room, complaining of severe uterine contractions.  Her cervix had progressed from 2 cm in the office to 4 cm in the MIU.  She was given a spinal anesthetic by Dr. Arby Barrette, placed in the supine position, tilted to the left.  Fetal heart tones were normal.  A Foley catheter was inserted by prepping the urethra with Betadine.  The abdomen was prepped with DuraPrep, was allowed to dry for 3 minutes.  A time-out was done. Anesthesia was confirmed after sterile draping by pinching the lower abdomen with Allis clamps.  The old incision was utilized to make the incision through the skin, subcutaneous tissue, and fascia.  The fascia was then separated from the rectus muscles superiorly and inferiorly. Rectus muscle was adherent together in the midline.  I created a midline, entered the peritoneal cavity, saw no adhesions.  Used a bladder retractor to hold the bladder inferiorly and Richardson retractor to hold the bowel superiorly, made a short transverse incision through the superficial layers of the myometrium with the knife in a transverse direction, entered the amniotic sac with my finger and it  was apparent when the fluid came out that the fluid smelled foul.  The infant was easily delivered through the incisional opening without any fundal pressure.  The shoulders were delivered after the nose and pharynx was suctioned with the bulb.  The cord was clamped.  The infant was given to the neonatal team who was in attendance.  They assigned Apgars of 8 and 9 at 1 and 5 minutes.  Cord blood was obtained.  The placenta was removed.  Uterus was massaged because it was somewhat boggy but became of good tone shortly thereafter.  The inside of the uterus was inspected, found to be free of any remaining products of conception. All angles of the uterus were grasped with a ring forceps and the uterine incision was closed with 2 running sutures of 0 Vicryl, locking the first layer, nonlocking on the second layer.  No additional sutures were required for hemostasis.  Liberal irrigation confirmed hemostasis. The gutters were blotted free of blood and debris.  Both tubes and ovaries and the uterus appeared normal.  A sponge had been used to hold the omentum out of the way.  This was removed and the abdominal wall was closed in layers using interrupted sutures of 0 Vicryl to close the rectus muscle and peritoneum  in 1 layer.  Two running sutures of 0 Vicryl in the fascia, running 3-0 Vicryl on the subcutaneous tissue after irrigating the subcutaneous tissue and staples were used to close the skin.  The patient seemed to tolerate the procedure well.  Blood loss was estimated at no more than 800 to 1000 mL.  Sponge and needle counts were correct, and she was returned to recovery in satisfactory condition.     Malachi Pro. Ambrose Mantle, M.D.     TFH/MEDQ  D:  10/29/2013  T:  10/29/2013  Job:  [id number]

## 2013-10-29 NOTE — Pre-Procedure Instructions (Signed)
Patient was having contractions upon arrival to hospital.  Per patient, contractions were approx 5 mnutes apart.  Patient refused to go to MAU to be checked out and PAT done in MAU.  Patient insisted on having PAT completed in office and seeing Dr Ambrose Mantle after PAT appt.  Katie in Lab and Pam in Admissions also tried to talk patient in going to MAU without success.  Patient refused to go to MAU against medical advice from RN.

## 2013-10-29 NOTE — Patient Instructions (Addendum)
   Your procedure is scheduled on:  Tuesday, Sept 15  Enter through the [employer] of [facility] at:  8 AM Pick up the phone at the desk and dial [phone] and inform us of your arrival.  Please call this number if you have any problems the morning of surgery: [phone]  Remember: Do not eat or drink after midnight: tonight - Monday Take these medicines the morning of surgery with a SIP OF WATER:  None  Do not wear jewelry, make-up, or FINGER nail polish No metal in your hair or on your body. Do not wear lotions, powders, perfumes.  You may wear deodorant.  Do not bring valuables to the hospital. Contacts, dentures or bridgework may not be worn into surgery.  Leave suitcase in the car. After Surgery it may be brought to your room. For patients being admitted to the hospital, checkout time is 11:00am the day of discharge.  Home to be arranged prior to surgery.

## 2013-10-29 NOTE — Transfer of Care (Signed)
Immediate Anesthesia Transfer of Care Note  Patient: [name] [name]  Procedure(s) Performed: Procedure(s): CESAREAN SECTION (N/A)  Patient Location: PACU  Anesthesia Type:Spinal  Level of Consciousness: awake  Airway & Oxygen Therapy: Patient Spontanous Breathing  Post-op Assessment: Report given to PACU RN and Post -op Vital signs reviewed and stable  Post vital signs: stable  Complications: No apparent anesthesia complications

## 2013-10-29 NOTE — Progress Notes (Signed)
FHT from 9-13 reviewed.  Reactive NST, reg ctx which pt is not feeling.

## 2013-10-30 ENCOUNTER — Encounter (HOSPITAL_COMMUNITY): Payer: Self-pay | Admitting: Obstetrics and Gynecology

## 2013-10-30 LAB — CBC
HCT: 26.2 % — ABNORMAL LOW (ref 36.0–46.0)
HEMOGLOBIN: 9.2 g/dL — AB (ref 12.0–15.0)
MCH: 32.3 pg (ref 26.0–34.0)
MCHC: 35.1 g/dL (ref 30.0–36.0)
MCV: 91.9 fL (ref 78.0–100.0)
Platelets: 174 10*3/uL (ref 150–400)
RBC: 2.85 MIL/uL — AB (ref 3.87–5.11)
RDW: 12.6 % (ref 11.5–15.5)
WBC: 17.1 10*3/uL — AB (ref 4.0–10.5)

## 2013-10-30 NOTE — Anesthesia Postprocedure Evaluation (Signed)
  Anesthesia Post-op Note  Patient: [name] [name]  Procedure(s) Performed: Procedure(s): CESAREAN SECTION (N/A)  Patient Location: PACU and Mother/Baby  Anesthesia Type:Spinal  Level of Consciousness: awake, alert  and oriented  Airway and Oxygen Therapy: Patient Spontanous Breathing  Post-op Pain: mild  Post-op Assessment: Post-op Vital signs reviewed  Post-op Vital Signs: Reviewed and stable  Last Vitals:  Filed Vitals:   10/30/13 0526  BP: 131/75  Pulse: 101  Temp: 36.8 C  Resp: 22    Complications: No apparent anesthesia complications

## 2013-10-30 NOTE — Progress Notes (Signed)
Patient ID: [name] [name], female   DOB: 1995-10-23, 18 y.o.   MRN: [id number] #1 afebrile BP normal HGB 11.5 to 9.2 She has ambulated and voided. She has tolerated a diet and passed flatus. Output good.

## 2013-10-30 NOTE — Progress Notes (Signed)
[name] [name] requested no visitors at this time.  There is high levels of tension within the family dynamics.  Arguments between patient and FOB and her family.  A different FOB of her other children also present at hospital causing problems.  Social Worker informed will followup with family

## 2013-10-30 NOTE — Addendum Note (Signed)
Addendum created 10/30/13 0825 by Armanda Heritage, CRNA   Modules edited: Notes Section   Notes Section:  File: [id number]

## 2013-10-30 NOTE — Lactation Note (Signed)
This note was copied from the chart of [name] [name]. Lactation Consultation Note  Patient Name: [name] [name] [id number] Date: 10/30/2013 Reason for consult: Initial assessment;Other (Comment) (charting for exclusion) This is mom's third baby.  Her oldest child received expressed milk for 2 months but she was unable to breastfeed or pump for her daughter.  This newborn is latching with assistance of nurse or FOB.  Mom planning to both breast and formula feed and baby is less than 6 pounds and is receiving formula supplement.  Mom has been provided with a hand pump for stimulation of her milk supply and to provide ebm to baby as needed.  LC reviewed supply and demand, importance of frequent STS with mom and breastfeeding at least every 3 hours and more often if baby is cuing.  Baby has had a few breastfeeding attempts and one sustained latch this afternoon for 20 minutes and a latch score=8, per RN assessment.   Mom encouraged to feed baby 8-12 times/24 hours and with feeding cues. LC encouraged review of Baby and Me pp 9, 14 and 20-25 for STS and BF information.  LC provided [employer] brochure and reviewed [facility] services and list of community and web site resources.    Maternal Data Formula Feeding for Exclusion: Yes Reason for exclusion: Mother's choice to formula and breast feed on admission Has patient been taught Hand Expression?: Yes (per RN at 1512 feeding and confirmed by mom) Does the patient have breastfeeding experience prior to this delivery?: Yes  Feeding Feeding Type: Bottle Fed - Formula  LATCH Score/Interventions            Most recent LATCH score=8, per RN assessment          Lactation Tools Discussed/Used Initiated by:: RN Date initiated:: 10/30/13 STS, cue feeding but at least q3h, hand expression  Consult Status Consult Status: Follow-up Date: 10/31/13 Follow-up type: In-patient    Warrick Parisian [facility] 10/30/2013, 9:39 PM

## 2013-10-30 NOTE — Progress Notes (Addendum)
Clinical Social Work Department PSYCHOSOCIAL ASSESSMENT - MATERNAL/CHILD 06-18-13  Patient:  [name] [name]  Account Number:  [id number]  [place] Date:  11-10-13  Ardine Eng Name:   Georgianne Fick   Clinical Social Worker:  Lucita Ferrara, CLINICAL SOCIAL WORKER   Date/Time:  May 12, 2013 01:30 PM  Date Referred:  Sep 14, 2013   Referral source  Central Nursery     Referred reason  Psychosocial assessment   Other referral source:   Nursing staff concerned about family dynamics that have lead to conflict beteween MOB, FOB, and parents while on MBU.    I:  FAMILY / HOME ENVIRONMENT Child's legal guardian:  PARENT  Guardian - Name Guardian - Age Guardian - Address  Clelia Schaumann [address] [place], [place] [address]  Gershon Mussel 18 other residence in [place], [place]   Other household support members/support persons Name Relationship DOB   DAUGHTER 2012   SON 2014   Other support:   MOB reported highly strained relationships with her parents with whom she lives with.    II  PSYCHOSOCIAL DATA Information Source:  Family Interview  Financial and [employer] Employment:   MOB is currently unemployed.  FOB is a seasonal worker in [place], [place].   Financial resources:  Medicaid If Medicaid - County:  [place] Other  [place] / Grade:  N/A Music therapist / Child Services Coordination / Early Interventions:   None reported.  Cultural issues impacting care:   None reported.    III  STRENGTHS Strengths  Adequate Resources  Home prepared for Child (including basic supplies)  Supportive family/friends   Strength comment:    IV  RISK FACTORS AND CURRENT PROBLEMS Current Problem:  YES   Risk Factor & Current Problem Patient Issue Family Issue Risk Factor / Current Problem Comment  Family/Relationship Issues Y Y Per MOB, her parents do not approve of the FOB.  She stated that she also has a highly strained relationship with  her parents with whom she lives with.  Mental Illness Y N Upon inquiry, MOB reported history of bipolar and stated that she has not been on medications since she learned that she was pregnant.  She declined desire to restart medications at this time.    V  SOCIAL WORK ASSESSMENT CSW met with MOB and FOB in order to complete the assessment. Consult was ordered in order to complete a psychosocial assessment due to notable strained family dynamics between the MOB, FOB, and MOB's parents while MOB has been on [place].  MOB was minimally receptive to meeting with the CSW.  She was guarded and acknowledged history of disliking talking about her feelings.  CSW validated discomfort associated with meeting the CSW as there is no prior history of established rapport.  MOB presented with a flattened affect and displayed minimal range in affect.  She was attentive to the newborn, but CSW did have to ask the FOB to allow MOB to speak as FOB would frequently attempt to answer questions for her.   CSW inquired about current stress level since CSW had been informed that there had been stress between the MOB, FOB, and MOB's parents earlier in the day.  MOB shared that "there is always stress", and discussed her perceptions that her parents do not like the FOB because he is "black".  She stated that her parents do not want her other children spending time with this FOB, and discussed how it causes constant conflict between herself  and her parents.  She also shared belief that her parents do not like her children much since they are her children and that they do not like her because she with the FOB.  CSW validated the challenges and stress that she feels since she currently lives with her parents and they are caring for her children while she is on MBU. MOB responded minimally to the validation, but stated that she is used to it.  CSW inquired about how she is coping with this stress, and MOB stated that "I'm not".  FOB discussed  that MOB is emotional and can "take it out on objects" when she begins to feel overwhelmed.  MOB and FOB denied belief that her children are being impacted by her emotions since "we do not fight in front of the children" and due to "I don't hit objects in front of them".  CSW attempted to provide education on how children are impacted since they can sense feelings, but MOB continued to share strong belief that her children are not impacted.  CSW explored with MOB her beliefs on how her life may be impacted if she continues to have high stress at home between herself and her parents.  FOB began to discuss their plans to move to [place] in order to move away from the family drama.  CSW asked MOB how she felt about this potential move, and she stated that its "mostly good".  She stated that she is looking forward to getting away from her family, but that she is concerned since she is from [place] and has limited awareness of how to secure employment and has few friends outside of the FOB in [place].  FOB provided impression that they will be moving to [place] within the next week, but MOB stated that she plans on having the newborn attend a follow-up appointment with the pediatrician here and that she needs to "receive some checks" prior to moving.  CSW was unable to clarify their exact moving plans, but MOB declined offer to further process this stressor.   Due to MOB's report that she sometimes can get angry and experience physical aggression, CSW inquired about mental health history.  She never confirmed or denied history of bipolar, but FOB stated that she gets "angry".  MOB was able to confirm prior participation in medication management.  She never identified a specific medication that she took, but stated that she discontinued the medication when she learned that she was pregnant.  MOB declined need to re-start medication at this time despite her verbalization of her mood swings and anger  that she experienced during her pregnancy.  CSW shared concerns related to increase risk for experiencing postpartum depression due to increase stress and prior mental health history.  FOB shared that he will notify medical providers if it occurs.  MOB denied concerns about developing postpartum depression since she did not experience symptoms with her other children.     VI SOCIAL WORK PLAN Social Work Plan  Patient/Family Education  Information/Referral to Community Resources  No Further Intervention Required / No Barriers to Discharge   Type of pt/family education:   Postpartum depression   If child protective services report - county:   If child protective services report - date:   Information/referral to community resources comment:   CSW offered to provide referral for therapy and medication management; however, MOB declined offer and stated that she plans on moving to [place] in the next couple of weeks which also limits   ability for CSW to make referrals.   Other social work plan:   CSW confirmed that MOB does not have an open CPS case.  CSW to follow case while on MBU.  Please re-consult CSW if additional needs/concerns arise.     

## 2013-10-31 NOTE — Lactation Note (Signed)
This note was copied from the chart of [name] Shenna Brissette. Lactation Consultation Note MBU RN request comfort gels for mom who has blister on nipples.  Mom has experiences with breastfeeding and is not accepting assistance from [facility].   Patient Name: [name] [name] [id number] Date: 10/31/2013     Maternal Data    Feeding Feeding Type: Breast Fed Length of feed: 15 min  LATCH Score/Interventions                      Lactation Tools Discussed/Used     Consult Status      [name] [name], [name] [name] 10/31/2013, 10:43 PM

## 2013-10-31 NOTE — Progress Notes (Signed)
Patient ID: [name] [name], female   DOB: 1995/05/29, 18 y.o.   MRN: [id number] #2 VS are normal. She states she has had a BM Her abdomen is soft and flat. She states her incision feels as if it is on fire. She does not feel she is ready for d/c

## 2013-11-01 MED ORDER — IBUPROFEN 600 MG PO TABS: 600.0000 mg | ORAL_TABLET | Freq: Four times a day (QID) | ORAL | Status: DC | PRN

## 2013-11-01 MED ORDER — MEASLES, MUMPS & RUBELLA VAC ~~LOC~~ INJ: 0.5000 mL | INJECTION | Freq: Once | SUBCUTANEOUS | Status: DC

## 2013-11-01 MED ORDER — OXYCODONE-ACETAMINOPHEN 5-325 MG PO TABS: 1.0000 | ORAL_TABLET | Freq: Four times a day (QID) | ORAL | Status: DC | PRN

## 2013-11-01 NOTE — Discharge Instructions (Signed)
booklet °

## 2013-11-01 NOTE — Discharge Summary (Signed)
NAMEMARYCLAIRE, [name] [name]                  ACCOUNT NO.:  [id number]  MEDICAL RECORD NO.:  [id number]  LOCATION:  9133                          FACILITY:  [facility]  PHYSICIAN:  Malachi Pro. Ambrose Mantle, M.D. DATE OF BIRTH:  Aug 09, 1995  DATE OF ADMISSION:  10/29/2013 DATE OF DISCHARGE:  11/01/2013                              DISCHARGE SUMMARY   HOSPITAL COURSE:  A 18 year old white female, para 1-1-1-2, gravida 4 with [facility] November 06, 2013 admitted in labor with 2 prior C-sections. She was brought to the operating room for repeat C-section.  Blood group and type B positive, negative antibody, RPR negative.  Urine culture positive treated with Macrobid.  Hepatitis B surface antigen negative, HIV negative.  GC and Chlamydia negative.  Rubella not immune.  The patient was noncompliant during her prenatal course.  When she was seen the day of admission, she had not been seen in the office for 1 month. Her cervix was 2 cm in the office with contractions.  When she arrived in MIU and examined she was 4 cm.  Prenatal course complicated by smoking both cigarettes and marijuana.  After admission to the hospital, the patient was taken to the operating room and underwent a low- transverse cervical C-section by Dr. Ambrose Mantle with Dr. Senaida Ores assisting under spinal anesthesia by Dr. Arby Barrette.  The baby smelled quite foul at the time of delivery.  This was reported to the pediatricians.  Postoperatively, the patient did acceptably well.  She had rapid resumption of normal function including ambulating, voiding well, tolerating a diet and passing flatus, and having bowel movements. On the third postop day, she was ready for discharge.  Initial hemoglobin 11.5, hematocrit 33.0, white count 17,700, platelet count 223,000.  Followup hemoglobin 9.2, RPR nonreactive.  FINAL DIAGNOSES:  Intrauterine pregnancy at 38 weeks and 6 days, active labor, two prior cesarean sections.  OPERATION:  Low-transverse cervical  C-section.  FINAL CONDITION:  Improved.  INSTRUCTIONS:  Include our regular discharge instruction booklet as well as the after-visit summary.  Prescriptions for Percocet 5/325, 30 tablets, one every 6 hours as needed for severe pain; Motrin 600 mg, #30, one every 6 hours for mild pain.  The patient is advised to continue her prenatal vitamins.  Return to the office in 4 days to have her staples removed.     Malachi Pro. Ambrose Mantle, M.D.     TFH/MEDQ  D:  11/01/2013  T:  11/01/2013  Job:  [id number]

## 2013-11-01 NOTE — Progress Notes (Signed)
Patient ID: [name] [name], female   DOB: Nov 18, 1995, 18 y.o.   MRN: [id number] #3 afebrile BP normal for d/c

## 2013-12-17 ENCOUNTER — Encounter (HOSPITAL_COMMUNITY): Payer: Self-pay | Admitting: Obstetrics and Gynecology

## 2014-07-05 ENCOUNTER — Encounter (HOSPITAL_COMMUNITY): Payer: Self-pay

## 2014-07-05 ENCOUNTER — Inpatient Hospital Stay (HOSPITAL_COMMUNITY)
Admission: AD | Admit: 2014-07-05 | Discharge: 2014-07-05 | Disposition: A | Discharge status: Home / Self Care | Payer: Medicaid Other | Source: Ambulatory Visit | Attending: Family Medicine | Admitting: Family Medicine

## 2014-07-05 DIAGNOSIS — O9989 Other specified diseases and conditions complicating pregnancy, childbirth and the puerperium: Secondary | ICD-10-CM | POA: Diagnosis present

## 2014-07-05 DIAGNOSIS — Z5320 Procedure and treatment not carried out because of patient's decision for unspecified reasons: Secondary | ICD-10-CM | POA: Insufficient documentation

## 2014-07-05 LAB — POCT PREGNANCY, URINE: Preg Test, Ur: POSITIVE — AB

## 2014-07-05 LAB — URINALYSIS, ROUTINE W REFLEX MICROSCOPIC
BILIRUBIN URINE: NEGATIVE
Glucose, UA: NEGATIVE mg/dL
HGB URINE DIPSTICK: NEGATIVE
Ketones, ur: NEGATIVE mg/dL
Leukocytes, UA: NEGATIVE
Nitrite: NEGATIVE
Protein, ur: NEGATIVE mg/dL
SPECIFIC GRAVITY, URINE: 1.015 (ref 1.005–1.030)
UROBILINOGEN UA: 0.2 mg/dL (ref 0.0–1.0)
pH: 7 (ref 5.0–8.0)

## 2014-07-05 NOTE — MAU Note (Signed)
Started feeling nauseated 3 or 4 days ago, +HPT.  Woke up this morning, having pain in lower abd.

## 2014-07-05 NOTE — MAU Note (Signed)
Pt not in lobby. Called and pt states she left to go home. Will not be returning to MAU

## 2014-07-08 ENCOUNTER — Inpatient Hospital Stay (HOSPITAL_COMMUNITY)
Admission: AD | Admit: 2014-07-08 | Discharge: 2014-07-08 | Disposition: A | Discharge status: Home / Self Care | Payer: Medicaid Other | Source: Ambulatory Visit | Attending: Obstetrics & Gynecology | Admitting: Obstetrics & Gynecology

## 2014-07-08 ENCOUNTER — Encounter (HOSPITAL_COMMUNITY): Payer: Self-pay

## 2014-07-08 DIAGNOSIS — R109 Unspecified abdominal pain: Secondary | ICD-10-CM | POA: Diagnosis present

## 2014-07-08 DIAGNOSIS — O9989 Other specified diseases and conditions complicating pregnancy, childbirth and the puerperium: Secondary | ICD-10-CM

## 2014-07-08 DIAGNOSIS — O99331 Smoking (tobacco) complicating pregnancy, first trimester: Secondary | ICD-10-CM | POA: Diagnosis not present

## 2014-07-08 DIAGNOSIS — F17210 Nicotine dependence, cigarettes, uncomplicated: Secondary | ICD-10-CM | POA: Diagnosis not present

## 2014-07-08 DIAGNOSIS — E162 Hypoglycemia, unspecified: Secondary | ICD-10-CM | POA: Insufficient documentation

## 2014-07-08 DIAGNOSIS — Z3A13 13 weeks gestation of pregnancy: Secondary | ICD-10-CM | POA: Diagnosis not present

## 2014-07-08 LAB — URINALYSIS, ROUTINE W REFLEX MICROSCOPIC
Bilirubin Urine: NEGATIVE
GLUCOSE, UA: NEGATIVE mg/dL
Hgb urine dipstick: NEGATIVE
Ketones, ur: NEGATIVE mg/dL
LEUKOCYTES UA: NEGATIVE
Nitrite: POSITIVE — AB
PH: 7 (ref 5.0–8.0)
Protein, ur: NEGATIVE mg/dL
Specific Gravity, Urine: 1.02 (ref 1.005–1.030)
UROBILINOGEN UA: 0.2 mg/dL (ref 0.0–1.0)

## 2014-07-08 LAB — URINE MICROSCOPIC-ADD ON

## 2014-07-08 NOTE — MAU Note (Signed)
While cooking dinner had episode of dizziness, right sided hand numbness and facial numbness. Abdominal pain during that episode. Denies vaginal bleeding. States the numbness comes and goes since the episode at dinner time.

## 2014-07-08 NOTE — MAU Provider Note (Signed)
History   [id number] at apx 13 wks in with c/o intermittant abd pain and episode of weakness numbness while cooking supper. Diet review revels pancakes for breakfast, dumplings for lunch. Discussed importance of increasing protein in diet and cutting back on carbs.  CSN: [id number]  Arrival date and time: 07/08/14 1934   None     Chief Complaint  Patient presents with  . Dizziness  . Abdominal Pain   HPI  OB History    Gravida Para Term Preterm AB TAB SAB Ectopic Multiple Living   6 3 2 1 2  0 2 0 0 3      Past Medical History  Diagnosis Date  . Anemia   . Preterm labor   . Infection   . 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 Headache(784.0)     otc med prn  . Missed abortion 2012, 2014    x 2    Past Surgical History  Procedure Laterality Date  . Cesarean section  04/2010    at 36 wks  . Cesarean section N/A 05/16/2012    Procedure: CESAREAN SECTION;  Surgeon: Bing Plumehomas F Henley, MD;  Location: [facility];  Service: Obstetrics;  Laterality: N/A;  repeat   . Cesarean section N/A 10/29/2013    Procedure: CESAREAN SECTION;  Surgeon: Bing Plumehomas F Henley, MD;  Location: [facility];  Service: Obstetrics;  Laterality: N/A;    Family History  Problem Relation Age of Onset  . Alcohol abuse Mother   . Diabetes Maternal Grandmother   . Heart disease Maternal Grandmother   . Hyperlipidemia Maternal Grandmother   . Hypertension Maternal Grandmother   . Cancer Maternal Grandmother   . COPD Maternal Grandmother   . Arthritis Maternal Grandmother   . Alcohol abuse Maternal Grandfather     History  Substance Use Topics  . Smoking status: Current Every Day Smoker -- 0.50 packs/day for 7 years    Types: Cigarettes  . Smokeless tobacco: Never Used  . Alcohol Use: No    Allergies: No Known Allergies  Prescriptions prior to admission  Medication Sig Dispense Refill Last Dose  . ibuprofen (ADVIL,MOTRIN) 600 MG tablet Take 1 tablet (600 mg total) by mouth every 6 (six)  hours as needed for mild pain. 30 tablet 0   . oxyCODONE-acetaminophen (PERCOCET/ROXICET) 5-325 MG per tablet Take 1 tablet by mouth every 6 (six) hours as needed for severe pain (for pain scale less than 7). 30 tablet 0   . Prenatal Vit-Fe Fumarate-FA (PRENATAL MULTIVITAMIN) TABS tablet Take 1 tablet by mouth daily.   Past Month at Unknown time    Review of Systems  HENT: Negative.   Eyes: Negative.   Respiratory: Negative.   Cardiovascular: Negative.   Gastrointestinal: Positive for abdominal pain.  Genitourinary: Negative.   Musculoskeletal: Negative.   Skin: Negative.   Neurological: Positive for weakness.  Endo/Heme/Allergies: Negative.   Psychiatric/Behavioral: Negative.    Physical Exam   Blood pressure 129/76, pulse 97, temperature 98.7 F (37.1 C), temperature source Oral, resp. rate 18, height 5\' 2"  (1.575 m), weight 162 lb (73.483 kg), last menstrual period 04/09/2014, SpO2 100 %, not currently breastfeeding.  Physical Exam  Constitutional: She is oriented to person, place, and time. She appears well-developed and well-nourished.  HENT:  Head: Normocephalic.  Eyes: Pupils are equal, round, and reactive to light.  Neck: Normal range of motion. Neck supple.  Cardiovascular: Normal rate, regular rhythm, normal heart sounds and intact distal pulses.  Respiratory: Effort normal and breath sounds normal.  GI: Soft. Bowel sounds are normal.  Genitourinary: Vagina normal and uterus normal.  Musculoskeletal: Normal range of motion.  Neurological: She is alert and oriented to person, place, and time. She has normal reflexes.  Skin: Skin is warm and dry.  Psychiatric: She has a normal mood and affect. Her behavior is normal. Judgment and thought content normal.    MAU Course  Procedures  MDM Hypoglycemia in pregnancy.  Assessment and Plan  fhr 160s st and reg with doppler. To increase protein and decrease carbs. Will d/c home  Wyvonnia Dusky DARLENE 07/08/2014, 8:03 PM

## 2014-07-08 NOTE — Discharge Instructions (Signed)
Hypoglycemia °Hypoglycemia occurs when the glucose in your blood is too low. Glucose is a type of sugar that is your body's main energy source. Hormones, such as insulin and glucagon, control the level of glucose in the blood. Insulin lowers blood glucose and glucagon increases blood glucose. Having too much insulin in your blood stream, or not eating enough food containing sugar, can result in hypoglycemia. Hypoglycemia can happen to people with or without diabetes. It can develop quickly and can be a medical emergency.  °CAUSES  °· Missing or delaying meals. °· Not eating enough carbohydrates at meals. °· Taking too much diabetes medicine. °· Not timing your oral diabetes medicine or insulin doses with meals, snacks, and exercise. °· Nausea and vomiting. °· Certain medicines. °· Severe illnesses, such as hepatitis, kidney disorders, and certain eating disorders. °· Increased activity or exercise without eating something extra or adjusting medicines. °· Drinking too much alcohol. °· A nerve disorder that affects body functions like your heart rate, blood pressure, and digestion (autonomic neuropathy). °· A condition where the stomach muscles do not function properly (gastroparesis). Therefore, medicines and food may not absorb properly. °· Rarely, a tumor of the pancreas can produce too much insulin. °SYMPTOMS  °· Hunger. °· Sweating (diaphoresis). °· Change in body temperature. °· Shakiness. °· Headache. °· Anxiety. °· Lightheadedness. °· Irritability. °· Difficulty concentrating. °· Dry mouth. °· Tingling or numbness in the hands or feet. °· Restless sleep or sleep disturbances. °· Altered speech and coordination. °· Change in mental status. °· Seizures or prolonged convulsions. °· Combativeness. °· Drowsiness (lethargic). °· Weakness. °· Increased heart rate or palpitations. °· Confusion. °· Pale, gray skin color. °· Blurred or double vision. °· Fainting. °DIAGNOSIS  °A physical exam and medical history will be  performed. Your caregiver may make a diagnosis based on your symptoms. Blood tests and other lab tests may be performed to confirm a diagnosis. Once the diagnosis is made, your caregiver will see if your signs and symptoms go away once your blood glucose is raised.  °TREATMENT  °Usually, you can easily treat your hypoglycemia when you notice symptoms. °· Check your blood glucose. If it is less than 70 mg/dl, take one of the following:   °¨ 3-4 glucose tablets.   °¨ ½ cup juice.   °¨ ½ cup regular soda.   °¨ 1 cup skim milk.   °¨ ½-1 tube of glucose gel.   °¨ 5-6 hard candies.   °· Avoid high-fat drinks or food that may delay a rise in blood glucose levels. °· Do not take more than the recommended amount of sugary foods, drinks, gel, or tablets. Doing so will cause your blood glucose to go too high.   °· Wait 10-15 minutes and recheck your blood glucose. If it is still less than 70 mg/dl or below your target range, repeat treatment.   °· Eat a snack if it is more than 1 hour until your next meal.   °There may be a time when your blood glucose may go so low that you are unable to treat yourself at home when you start to notice symptoms. You may need someone to help you. You may even faint or be unable to swallow. If you cannot treat yourself, someone will need to bring you to the hospital.  °HOME CARE INSTRUCTIONS °· If you have diabetes, follow your diabetes management plan by: °¨ Taking your medicines as directed. °¨ Following your exercise plan. °¨ Following your meal plan. Do not skip meals. Eat on time. °¨ Testing your blood   glucose regularly. Check your blood glucose before and after exercise. If you exercise longer or different than usual, be sure to check blood glucose more frequently. °¨ Wearing your medical alert jewelry that says you have diabetes. °· Identify the cause of your hypoglycemia. Then, develop ways to prevent the recurrence of hypoglycemia. °· Do not take a hot bath or shower right after an  insulin shot. °· Always carry treatment with you. Glucose tablets are the easiest to carry. °· If you are going to drink alcohol, drink it only with meals. °· Tell friends or family members ways to keep you safe during a seizure. This may include removing hard or sharp objects from the area or turning you on your side. °· Maintain a healthy weight. °SEEK MEDICAL CARE IF:  °· You are having problems keeping your blood glucose in your target range. °· You are having frequent episodes of hypoglycemia. °· You feel you might be having side effects from your medicines. °· You are not sure why your blood glucose is dropping so low. °· You notice a change in vision or a new problem with your vision. °SEEK IMMEDIATE MEDICAL CARE IF:  °· Confusion develops. °· A change in mental status occurs. °· The inability to swallow develops. °· Fainting occurs. °Document Released: 02/01/2005 Document Revised: 02/06/2013 Document Reviewed: 05/31/2011 °[employer]® Patient Information ©2015 [employer]. This information is not intended to replace advice given to you by your health care provider. Make sure you discuss any questions you have with your health care provider. ° °

## 2015-05-10 ENCOUNTER — Encounter (HOSPITAL_COMMUNITY): Payer: Self-pay | Admitting: *Deleted

## 2019-08-18 ENCOUNTER — Emergency Department (HOSPITAL_BASED_OUTPATIENT_CLINIC_OR_DEPARTMENT_OTHER)
Admission: EM | Admit: 2019-08-18 | Discharge: 2019-08-18 | Disposition: A | Discharge status: Home / Self Care | Payer: Medicaid Other | Attending: Emergency Medicine | Admitting: Emergency Medicine

## 2019-08-18 ENCOUNTER — Emergency Department (HOSPITAL_BASED_OUTPATIENT_CLINIC_OR_DEPARTMENT_OTHER): Payer: Medicaid Other

## 2019-08-18 ENCOUNTER — Encounter (HOSPITAL_BASED_OUTPATIENT_CLINIC_OR_DEPARTMENT_OTHER): Payer: Self-pay | Admitting: Emergency Medicine

## 2019-08-18 DIAGNOSIS — Y9289 Other specified places as the place of occurrence of the external cause: Secondary | ICD-10-CM | POA: Diagnosis not present

## 2019-08-18 DIAGNOSIS — S0512XA Contusion of eyeball and orbital tissues, left eye, initial encounter: Secondary | ICD-10-CM | POA: Insufficient documentation

## 2019-08-18 DIAGNOSIS — S0181XA Laceration without foreign body of other part of head, initial encounter: Secondary | ICD-10-CM | POA: Diagnosis present

## 2019-08-18 DIAGNOSIS — Y9389 Activity, other specified: Secondary | ICD-10-CM | POA: Insufficient documentation

## 2019-08-18 DIAGNOSIS — M542 Cervicalgia: Secondary | ICD-10-CM | POA: Insufficient documentation

## 2019-08-18 DIAGNOSIS — Y999 Unspecified external cause status: Secondary | ICD-10-CM | POA: Insufficient documentation

## 2019-08-18 DIAGNOSIS — Z23 Encounter for immunization: Secondary | ICD-10-CM | POA: Insufficient documentation

## 2019-08-18 DIAGNOSIS — F17210 Nicotine dependence, cigarettes, uncomplicated: Secondary | ICD-10-CM | POA: Insufficient documentation

## 2019-08-18 MED ORDER — NAPROXEN 500 MG PO TABS
500.0000 mg | ORAL_TABLET | Freq: Two times a day (BID) | ORAL | 0 refills | Status: DC | PRN
Start: 2019-08-18 — End: 2019-08-18

## 2019-08-18 MED ORDER — LIDOCAINE-EPINEPHRINE 2 %-1:100000 IJ SOLN
5.0000 mL | Freq: Once | INTRAMUSCULAR | Status: DC
  Filled 2019-08-18: qty 5.1

## 2019-08-18 MED ORDER — LIDOCAINE-EPINEPHRINE (PF) 2 %-1:200000 IJ SOLN
INTRAMUSCULAR | Status: AC
  Filled 2019-08-18: qty 10

## 2019-08-18 MED ORDER — HYDROCODONE-ACETAMINOPHEN 5-325 MG PO TABS
1.0000 | ORAL_TABLET | Freq: Once | ORAL | Status: AC
  Administered 2019-08-18: 1 via ORAL
  Filled 2019-08-18: qty 1

## 2019-08-18 MED ORDER — OXYCODONE-ACETAMINOPHEN 5-325 MG PO TABS: 2.0000 | ORAL_TABLET | Freq: Three times a day (TID) | ORAL | 0 refills | Status: DC | PRN

## 2019-08-18 MED ORDER — LIDOCAINE-EPINEPHRINE (PF) 2 %-1:200000 IJ SOLN: 10.0000 mL | Freq: Once | INTRAMUSCULAR | Status: DC

## 2019-08-18 MED ORDER — TETANUS-DIPHTH-ACELL PERTUSSIS 5-2.5-18.5 LF-MCG/0.5 IM SUSP
0.5000 mL | Freq: Once | INTRAMUSCULAR | Status: AC
  Administered 2019-08-18: 0.5 mL via INTRAMUSCULAR
  Filled 2019-08-18: qty 0.5

## 2019-08-18 MED ORDER — NAPROXEN 500 MG PO TABS
500.0000 mg | ORAL_TABLET | Freq: Two times a day (BID) | ORAL | 0 refills | Status: DC | PRN
Start: 2019-08-18 — End: 2019-08-19

## 2019-08-18 NOTE — ED Notes (Signed)
PO pain med given as per orders, safety measures in place, sr x 2 up, family at bedside
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
## 2019-08-18 NOTE — ED Notes (Signed)
AVS reviewed with pt, also reviewed the two Rx written by the ED Provider, both Rx given to pt. Opportunity for questions provided.

## 2019-08-18 NOTE — Discharge Instructions (Addendum)
Please read the attachments on concussion and tissue adhesive wound care.  Do not apply bacitracin or other ointments that might otherwise degrade the glue used here today.   It is imperative that you see a dentist for ongoing evaluation and management of your chipped teeth.  Please see the attachment on dental resources locally.  You were given narcotic and or sedative medications while in the emergency department. Do not drive. Do not use machinery or power tools. Do not sign legal documents. Do not drink alcohol. Do not take sleeping pills. Do not supervise children by yourself. Do not participate in activities that require climbing or being in high places.  Please return to the ED or seek immediate medical attention to experience any new or worsening symptoms.

## 2019-08-19 ENCOUNTER — Telehealth (HOSPITAL_BASED_OUTPATIENT_CLINIC_OR_DEPARTMENT_OTHER): Payer: Self-pay | Admitting: Physician Assistant

## 2019-08-19 ENCOUNTER — Telehealth (HOSPITAL_BASED_OUTPATIENT_CLINIC_OR_DEPARTMENT_OTHER): Payer: Self-pay | Admitting: Emergency Medicine

## 2019-08-19 MED ORDER — OXYCODONE-ACETAMINOPHEN 5-325 MG PO TABS: 2.0000 | ORAL_TABLET | Freq: Three times a day (TID) | ORAL | 0 refills | Status: DC | PRN

## 2019-08-19 MED ORDER — NAPROXEN 500 MG PO TABS
500.0000 mg | ORAL_TABLET | Freq: Two times a day (BID) | ORAL | 0 refills | Status: DC | PRN
Start: 2019-08-19 — End: 2023-09-18

## 2019-08-19 MED ORDER — NAPROXEN 500 MG PO TABS
500.0000 mg | ORAL_TABLET | Freq: Two times a day (BID) | ORAL | 0 refills | Status: DC
Start: 2019-08-19 — End: 2019-08-19

## 2019-08-19 NOTE — ED Provider Notes (Signed)
Evidently the printed narcotic prescription was not accepted and she asked that I call in her prescription.  I initially called into [facility] per her request, but closed for 4 July.  I subsequently had to cancel that order and then call in the prescription to [facility].  Charge RN to verify that [employer] order is cancelled.  Evidently printed narcotic prescriptions are no longer accepted in [place].   Lorelee New, PA-C 08/19/19 1252    Alvira Monday, MD 08/20/19 [phone]

## 2019-11-04 ENCOUNTER — Encounter (HOSPITAL_COMMUNITY): Payer: Self-pay | Admitting: *Deleted

## 2019-11-04 ENCOUNTER — Emergency Department (HOSPITAL_COMMUNITY)
Admission: EM | Admit: 2019-11-04 | Discharge: 2019-11-04 | Disposition: A | Discharge status: Home / Self Care | Payer: Medicaid Other | Attending: Emergency Medicine | Admitting: Emergency Medicine

## 2019-11-04 ENCOUNTER — Other Ambulatory Visit: Payer: Self-pay

## 2019-11-04 DIAGNOSIS — J029 Acute pharyngitis, unspecified: Secondary | ICD-10-CM | POA: Diagnosis present

## 2019-11-04 DIAGNOSIS — J020 Streptococcal pharyngitis: Secondary | ICD-10-CM | POA: Diagnosis not present

## 2019-11-04 DIAGNOSIS — F17210 Nicotine dependence, cigarettes, uncomplicated: Secondary | ICD-10-CM | POA: Insufficient documentation

## 2019-11-04 DIAGNOSIS — Z20822 Contact with and (suspected) exposure to covid-19: Secondary | ICD-10-CM | POA: Insufficient documentation

## 2019-11-04 LAB — SARS CORONAVIRUS 2 BY RT PCR (HOSPITAL ORDER, PERFORMED IN ~~LOC~~ HOSPITAL LAB): SARS Coronavirus 2: NEGATIVE

## 2019-11-04 LAB — GROUP A STREP BY PCR: Group A Strep by PCR: DETECTED — AB

## 2019-11-04 MED ORDER — PENICILLIN G BENZATHINE & PROC 1200000 UNIT/2ML IM SUSP
1.2000 10*6.[IU] | Freq: Once | INTRAMUSCULAR | Status: AC
  Administered 2019-11-04: 1.2 10*6.[IU] via INTRAMUSCULAR
  Filled 2019-11-04: qty 2

## 2019-11-04 MED ORDER — ACETAMINOPHEN 325 MG PO TABS
650.0000 mg | ORAL_TABLET | Freq: Once | ORAL | Status: AC
  Administered 2019-11-04: 650 mg via ORAL
  Filled 2019-11-04: qty 2

## 2019-11-04 NOTE — ED Notes (Signed)
An After Visit Summary was printed and given to the patient. Discharge instructions given and no further questions at this time.  Pt leaving with daughter, who is also discharged patient.

## 2019-11-04 NOTE — ED Provider Notes (Signed)
[facility]-EMERGENCY DEPT Provider Note   CSN: [id number] Arrival date & time: 11/04/19  1234     History Chief Complaint  Patient presents with  . Sore Throat  . Fatigue  . Emesis    [name] [name] is a 24 y.o. female here presenting with sore throat and fatigue.  Patient states that she has been having sore throat since yesterday.  Patient states that she has been vomiting occasionally but able to keep things down.  Denies being pregnant.  Patient states that her aunt was diagnosed with COVID and she visited her yesterday.  Has some low-grade temperature at home.  Her child is sick with similar symptoms.  The history is provided by the patient.       Past Medical History:  Diagnosis Date  . 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 Anemia   . Headache(784.0)    otc med prn  . Infection   . Missed abortion 2012, 2014   x 2  . Preterm labor     Patient Active Problem List   Diagnosis Date Noted  . H/O: cesarean section 10/29/2013  . Hypokalemia 04/05/2012    Past Surgical History:  Procedure Laterality Date  . CESAREAN SECTION  04/2010   at 36 wks  . CESAREAN SECTION N/A 05/16/2012   Procedure: CESAREAN SECTION;  Surgeon: Bing Plume, MD;  Location: [facility];  Service: Obstetrics;  Laterality: N/A;  repeat   . CESAREAN SECTION N/A 10/29/2013   Procedure: CESAREAN SECTION;  Surgeon: Bing Plume, MD;  Location: [facility];  Service: Obstetrics;  Laterality: N/A;     OB History    Gravida  6   Para  3   Term  2   Preterm  1   AB  2   Living  3     SAB  2   TAB  0   Ectopic  0   Multiple  0   Live Births  3           Family History  Problem Relation Age of Onset  . Alcohol abuse Mother   . Diabetes Maternal Grandmother   . Heart disease Maternal Grandmother   . Hyperlipidemia Maternal Grandmother   . Hypertension Maternal Grandmother   . Cancer Maternal Grandmother   . COPD Maternal Grandmother   .  Arthritis Maternal Grandmother   . Alcohol abuse Maternal Grandfather     Social History   Tobacco Use  . Smoking status: Current Every Day Smoker    Packs/day: 0.25    Years: 7.00    Pack years: 1.75    Types: Cigarettes  . Smokeless tobacco: Never Used  Substance Use Topics  . Alcohol use: No    Alcohol/week: 1.0 standard drink    Types: 1 Glasses of wine per week  . Drug use: No    Home Medications Prior to Admission medications   Medication Sig Start Date End Date Taking? Authorizing Provider  ibuprofen (ADVIL,MOTRIN) 600 MG tablet Take 1 tablet (600 mg total) by mouth every 6 (six) hours as needed for mild pain. 11/01/13   Tracey Harries, MD  naproxen (NAPROSYN) 500 MG tablet Take 1 tablet (500 mg total) by mouth 2 (two) times daily as needed for moderate pain. 08/19/19   Lorelee New, PA-C  oxyCODONE-acetaminophen (PERCOCET/ROXICET) 5-325 MG tablet Take 2 tablets by mouth every 8 (eight) hours as needed for severe pain. 08/19/19  Lorelee New, PA-C  Prenatal Vit-Fe Fumarate-FA (PRENATAL MULTIVITAMIN) TABS tablet Take 1 tablet by mouth daily.    [provider]    Allergies    Vicodin [hydrocodone-acetaminophen]  Review of Systems   Review of Systems  HENT: Positive for sore throat.   Gastrointestinal: Positive for vomiting.  All other systems reviewed and are negative.   Physical Exam Updated Vital Signs BP 111/77 (BP Location: Left Arm)   Pulse (!) 115   Temp 99.6 F (37.6 C) (Oral)   Resp 17   Ht 5\' 2"  (1.575 m)   Wt 76.2 kg   SpO2 98%   BMI 30.73 kg/m   Physical Exam Vitals and nursing note reviewed.  HENT:     Head: Normocephalic.     Right Ear: Tympanic membrane normal.     Left Ear: Tympanic membrane normal.     Mouth/Throat:     Comments: OP slightly red, no tonsillar exudates  Eyes:     Conjunctiva/sclera: Conjunctivae normal.  Cardiovascular:     Rate and Rhythm: Normal rate and regular rhythm.  Pulmonary:     Effort:  Pulmonary effort is normal.     Breath sounds: Normal breath sounds.  Abdominal:     General: Bowel sounds are normal.     Palpations: Abdomen is soft.  Musculoskeletal:     Cervical back: Normal range of motion and neck supple.  Skin:    General: Skin is warm.     Capillary Refill: Capillary refill takes less than 2 seconds.  Neurological:     General: No focal deficit present.     Mental Status: She is alert and oriented to person, place, and time.  Psychiatric:        Mood and Affect: Mood normal.        Behavior: Behavior normal.     ED Results / Procedures / Treatments   Labs (all labs ordered are listed, but only abnormal results are displayed) Labs Reviewed  GROUP A STREP BY PCR - Abnormal; Notable for the following components:      Result Value   Group A Strep by PCR DETECTED (*)    All other components within normal limits  SARS CORONAVIRUS 2 BY RT PCR (HOSPITAL ORDER, PERFORMED IN [facility])    EKG None  Radiology No results found.  Procedures Procedures (including critical care time)  Medications Ordered in ED Medications  acetaminophen (TYLENOL) tablet 650 mg (has no administration in time range)  penicillin g procaine-penicillin g benzathine (BICILLIN-CR) injection 600000-600000 units (has no administration in time range)    ED Course  I have reviewed the triage vital signs and the nursing notes.  Pertinent labs & imaging results that were available during my care of the patient were reviewed by me and considered in my medical decision making (see chart for details).    MDM Rules/Calculators/A&P                          [name] [name] is a 23 y.o. female here with sore throat. Group A strep positive. Tachycardia likely from dehydration and low grade temp. Had COVID exposure but not hypoxic. Didn't receive COVID vaccine. Ordered bicillin IM. COVID test pending and doesn't need admission for COVID. Stable for discharge   Final Clinical  Impression(s) / ED Diagnoses Final diagnoses:  None    Rx / DC Orders ED Discharge Orders    None  Charlynne Pander, MD 11/04/19 2116

## 2019-11-04 NOTE — Discharge Instructions (Signed)
You have strep throat. Rest for 2 days   You were tested for COVID, if you are positive, you will need to stay home for 10 days   Take tylenol, motrin for fever   Stay hydrated   See your doctor   Return to ER if you have worse sore throat, trouble swallowing, dehydration

## 2019-11-04 NOTE — ED Triage Notes (Signed)
Sore throat, fatigue vomiting since yesterday morning

## 2020-01-07 ENCOUNTER — Other Ambulatory Visit: Payer: Self-pay

## 2020-01-07 ENCOUNTER — Ambulatory Visit (HOSPITAL_COMMUNITY)
Admission: EM | Admit: 2020-01-07 | Discharge: 2020-01-07 | Disposition: A | Discharge status: Home / Self Care | Payer: Medicaid Other | Attending: Family Medicine | Admitting: Family Medicine

## 2020-01-07 ENCOUNTER — Encounter (HOSPITAL_COMMUNITY): Payer: Self-pay | Admitting: *Deleted

## 2020-01-07 DIAGNOSIS — K047 Periapical abscess without sinus: Secondary | ICD-10-CM | POA: Diagnosis not present

## 2020-01-07 DIAGNOSIS — Z3202 Encounter for pregnancy test, result negative: Secondary | ICD-10-CM

## 2020-01-07 LAB — POC URINE PREG, ED: Preg Test, Ur: NEGATIVE

## 2020-01-07 MED ORDER — AMOXICILLIN-POT CLAVULANATE 875-125 MG PO TABS
1.0000 | ORAL_TABLET | Freq: Two times a day (BID) | ORAL | 0 refills | Status: DC
Start: 2020-01-07 — End: 2021-07-09

## 2020-01-07 MED ORDER — TRAMADOL HCL 50 MG PO TABS: 50.0000 mg | ORAL_TABLET | Freq: Two times a day (BID) | ORAL | 0 refills | Status: DC | PRN

## 2020-01-07 MED ORDER — LIDOCAINE VISCOUS HCL 2 % MT SOLN
10.0000 mL | OROMUCOSAL | 0 refills | Status: AC | PRN
Start: 2020-01-07 — End: ?

## 2020-01-07 MED ORDER — KETOROLAC TROMETHAMINE 60 MG/2ML IM SOLN
INTRAMUSCULAR | Status: AC
  Filled 2020-01-07: qty 2

## 2020-01-07 MED ORDER — KETOROLAC TROMETHAMINE 60 MG/2ML IM SOLN
60.0000 mg | Freq: Once | INTRAMUSCULAR | Status: AC
  Administered 2020-01-07: 60 mg via INTRAMUSCULAR

## 2020-01-07 NOTE — ED Triage Notes (Signed)
Pt took 12 tylenol pm  tabs last night ( unknown dose) and 5 tabs -800 mg ibuprofen . Pain is 10 /10 pain now. Pt reports vomiting this AM yellow colored fluid.

## 2020-01-07 NOTE — ED Provider Notes (Signed)
[facility]    CSN: [id number] Arrival date & time: 01/07/20  1250      History   Chief Complaint Chief Complaint  Patient presents with  . Dental Problem    HPI [name] [name] is a 24 y.o. female.   Patient presenting today with several day hx of left upper jaw pain, swelling, headache and this morning also some N/V. Has tried tylenol, ibuprofen, viscous lidocaine without relief. Denies fever, chills, drainage from area, known injury to teeth. Does have a dentist, could not get appt until Feb per pt.       Past Medical History:  Diagnosis Date  . 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 Anemia   . Headache(784.0)    otc med prn  . Infection   . Missed abortion 2012, 2014   x 2  . Preterm labor     Patient Active Problem List   Diagnosis Date Noted  . H/O: cesarean section 10/29/2013  . Hypokalemia 04/05/2012    Past Surgical History:  Procedure Laterality Date  . CESAREAN SECTION  04/2010   at 36 wks  . CESAREAN SECTION N/A 05/16/2012   Procedure: CESAREAN SECTION;  Surgeon: Bing Plume, MD;  Location: [facility];  Service: Obstetrics;  Laterality: N/A;  repeat   . CESAREAN SECTION N/A 10/29/2013   Procedure: CESAREAN SECTION;  Surgeon: Bing Plume, MD;  Location: [facility];  Service: Obstetrics;  Laterality: N/A;    OB History    Gravida  6   Para  3   Term  2   Preterm  1   AB  2   Living  3     SAB  2   TAB  0   Ectopic  0   Multiple  0   Live Births  3            Home Medications    Prior to Admission medications   Medication Sig Start Date End Date Taking? Authorizing Provider  ibuprofen (ADVIL,MOTRIN) 600 MG tablet Take 1 tablet (600 mg total) by mouth every 6 (six) hours as needed for mild pain. 11/01/13  Yes Tracey Harries, MD  amoxicillin-clavulanate (AUGMENTIN) 875-125 MG tablet Take 1 tablet by mouth every 12 (twelve) hours. 01/07/20   Particia Nearing, PA-C  lidocaine (XYLOCAINE) 2 %  solution Use as directed 10 mLs in the mouth or throat as needed for mouth pain. 01/07/20   Particia Nearing, PA-C  naproxen (NAPROSYN) 500 MG tablet Take 1 tablet (500 mg total) by mouth 2 (two) times daily as needed for moderate pain. 08/19/19   Lorelee New, PA-C  oxyCODONE-acetaminophen (PERCOCET/ROXICET) 5-325 MG tablet Take 2 tablets by mouth every 8 (eight) hours as needed for severe pain. 08/19/19   Lorelee New, PA-C  Prenatal Vit-Fe Fumarate-FA (PRENATAL MULTIVITAMIN) TABS tablet Take 1 tablet by mouth daily.    [provider]  traMADol (ULTRAM) 50 MG tablet Take 1 tablet (50 mg total) by mouth every 12 (twelve) hours as needed. 01/07/20   Particia Nearing, PA-C    Family History Family History  Problem Relation Age of Onset  . Alcohol abuse Mother   . Diabetes Maternal Grandmother   . Heart disease Maternal Grandmother   . Hyperlipidemia Maternal Grandmother   . Hypertension Maternal Grandmother   . Cancer Maternal Grandmother   . COPD Maternal Grandmother   . Arthritis Maternal Grandmother   . Alcohol  abuse Maternal Grandfather     Social History Social History   Tobacco Use  . Smoking status: Current Every Day Smoker    Packs/day: 0.25    Years: 7.00    Pack years: 1.75    Types: Cigarettes  . Smokeless tobacco: Never Used  Substance Use Topics  . Alcohol use: No    Alcohol/week: 1.0 standard drink    Types: 1 Glasses of wine per week  . Drug use: No     Allergies   Vicodin [hydrocodone-acetaminophen]   Review of Systems Review of Systems PER HPI   Physical Exam Triage Vital Signs ED Triage Vitals  Enc Vitals Group     BP 01/07/20 1403 137/77     Pulse Rate 01/07/20 1403 (!) 101     Resp 01/07/20 1403 18     Temp 01/07/20 1403 98.9 F (37.2 C)     Temp Source 01/07/20 1403 Oral     SpO2 01/07/20 1403 97 %     Weight 01/07/20 1418 165 lb (74.8 kg)     Height 01/07/20 1418 5\' 2"  (1.575 m)     Head Circumference --       Peak Flow --      Pain Score 01/07/20 1417 10     Pain Loc --      Pain Edu? --      Excl. in GC? --    No data found.  Updated Vital Signs BP 137/77 (BP Location: Right Arm)   Pulse (!) 101   Temp 98.9 F (37.2 C) (Oral)   Resp 18   Ht 5\' 2"  (1.575 m)   Wt 165 lb (74.8 kg)   LMP 12/24/2019   SpO2 97%   BMI 30.18 kg/m   Visual Acuity Right Eye Distance:   Left Eye Distance:   Bilateral Distance:    Right Eye Near:   Left Eye Near:    Bilateral Near:     Physical Exam Vitals and nursing note reviewed.  Constitutional:      Appearance: Normal appearance. She is not ill-appearing.  HENT:     Head: Atraumatic.     Mouth/Throat:     Mouth: Mucous membranes are moist.     Comments: Poor dentition diffusely, multiple areas of decay particularly front teeth and forward most left upper teeth. Erythema, gingival edema. No active drainage Left facial swelling present Eyes:     Extraocular Movements: Extraocular movements intact.     Conjunctiva/sclera: Conjunctivae normal.  Cardiovascular:     Rate and Rhythm: Normal rate and regular rhythm.     Heart sounds: Normal heart sounds.  Pulmonary:     Effort: Pulmonary effort is normal.     Breath sounds: Normal breath sounds.  Musculoskeletal:        General: Normal range of motion.     Cervical back: Normal range of motion and neck supple.  Skin:    General: Skin is warm and dry.  Neurological:     Mental Status: She is alert and oriented to person, place, and time.  Psychiatric:        Mood and Affect: Mood normal.        Thought Content: Thought content normal.        Judgment: Judgment normal.     UC Treatments / Results  Labs (all labs ordered are listed, but only abnormal results are displayed) Labs Reviewed  POC URINE PREG, ED    EKG   Radiology No results  found.  Procedures Procedures (including critical care time)  Medications Ordered in UC Medications  ketorolac (TORADOL) injection 60 mg (60  mg Intramuscular Given 01/07/20 1513)    Initial Impression / Assessment and Plan / UC Course  I have reviewed the triage vital signs and the nursing notes.  Pertinent labs & imaging results that were available during my care of the patient were reviewed by me and considered in my medical decision making (see chart for details).     Will tx with augmentin, viscous lidocaine, and small amount of tramadol for severe pain with precautions reviewed at length. She has tolerated this well in the past and has not had an allergic reaction to it, only vicodin per patient. F/u with dentist as soon as able.   Final Clinical Impressions(s) / UC Diagnoses   Final diagnoses:  Dental infection   Discharge Instructions   None    ED Prescriptions    Medication Sig Dispense Auth. Provider   amoxicillin-clavulanate (AUGMENTIN) 875-125 MG tablet Take 1 tablet by mouth every 12 (twelve) hours. 14 tablet Particia Nearing, [place]   lidocaine (XYLOCAINE) 2 % solution Use as directed 10 mLs in the mouth or throat as needed for mouth pain. 100 mL Particia Nearing, PA-C   traMADol (ULTRAM) 50 MG tablet Take 1 tablet (50 mg total) by mouth every 12 (twelve) hours as needed. 5 tablet Particia Nearing, [place]     I have reviewed the PDMP during this encounter.   Particia Nearing, [place] 01/07/20 1639

## 2020-01-08 ENCOUNTER — Emergency Department
Admission: EM | Admit: 2020-01-08 | Discharge: 2020-01-08 | Disposition: A | Discharge status: Home / Self Care | Payer: Medicaid Other | Attending: Student in an Organized Health Care Education/Training Program | Admitting: Student in an Organized Health Care Education/Training Program

## 2020-01-08 ENCOUNTER — Emergency Department: Payer: Medicaid Other

## 2020-01-08 DIAGNOSIS — K047 Periapical abscess without sinus: Secondary | ICD-10-CM | POA: Diagnosis not present

## 2020-01-08 DIAGNOSIS — F17210 Nicotine dependence, cigarettes, uncomplicated: Secondary | ICD-10-CM | POA: Insufficient documentation

## 2020-01-08 DIAGNOSIS — L03211 Cellulitis of face: Secondary | ICD-10-CM | POA: Diagnosis not present

## 2020-01-08 DIAGNOSIS — R519 Headache, unspecified: Secondary | ICD-10-CM | POA: Diagnosis present

## 2020-01-08 LAB — CBC WITH DIFFERENTIAL/PLATELET
Abs Immature Granulocytes: 0.06 10*3/uL (ref 0.00–0.07)
Basophils Absolute: 0 10*3/uL (ref 0.0–0.1)
Basophils Relative: 0 %
Eosinophils Absolute: 0.1 10*3/uL (ref 0.0–0.5)
Eosinophils Relative: 1 %
HCT: 42.4 % (ref 36.0–46.0)
Hemoglobin: 14.6 g/dL (ref 12.0–15.0)
Immature Granulocytes: 1 %
Lymphocytes Relative: 22 %
Lymphs Abs: 2.5 10*3/uL (ref 0.7–4.0)
MCH: 32.2 pg (ref 26.0–34.0)
MCHC: 34.4 g/dL (ref 30.0–36.0)
MCV: 93.6 fL (ref 80.0–100.0)
Monocytes Absolute: 0.6 10*3/uL (ref 0.1–1.0)
Monocytes Relative: 6 %
Neutro Abs: 7.7 10*3/uL (ref 1.7–7.7)
Neutrophils Relative %: 70 %
Platelets: 201 10*3/uL (ref 150–400)
RBC: 4.53 MIL/uL (ref 3.87–5.11)
RDW: 13 % (ref 11.5–15.5)
WBC: 11.1 10*3/uL — ABNORMAL HIGH (ref 4.0–10.5)
nRBC: 0 % (ref 0.0–0.2)

## 2020-01-08 LAB — COMPREHENSIVE METABOLIC PANEL
ALT: 32 U/L (ref 0–44)
AST: 34 U/L (ref 15–41)
Albumin: 4.2 g/dL (ref 3.5–5.0)
Alkaline Phosphatase: 76 U/L (ref 38–126)
Anion gap: 7 (ref 5–15)
BUN: 17 mg/dL (ref 6–20)
CO2: 27 mmol/L (ref 22–32)
Calcium: 9 mg/dL (ref 8.9–10.3)
Chloride: 101 mmol/L (ref 98–111)
Creatinine, Ser: 1.03 mg/dL — ABNORMAL HIGH (ref 0.44–1.00)
GFR, Estimated: >60 mL/min (ref >60)
Glucose, Bld: 125 mg/dL — ABNORMAL HIGH (ref 70–99)
Potassium: 3.7 mmol/L (ref 3.5–5.1)
Sodium: 135 mmol/L (ref 135–145)
Total Bilirubin: 0.7 mg/dL (ref 0.3–1.2)
Total Protein: 7.2 g/dL (ref 6.5–8.1)

## 2020-01-08 MED ORDER — OXYCODONE-ACETAMINOPHEN 5-325 MG PO TABS: 1.0000 | ORAL_TABLET | Freq: Four times a day (QID) | ORAL | 0 refills | Status: DC | PRN

## 2020-01-08 MED ORDER — IOHEXOL 300 MG/ML  SOLN
75.0000 mL | Freq: Once | INTRAMUSCULAR | Status: AC | PRN
  Administered 2020-01-08: 75 mL via INTRAVENOUS
  Filled 2020-01-08: qty 75

## 2020-01-08 MED ORDER — CLINDAMYCIN PHOSPHATE 600 MG/50ML IV SOLN
600.0000 mg | Freq: Once | INTRAVENOUS | Status: AC
  Administered 2020-01-08: 600 mg via INTRAVENOUS
  Filled 2020-01-08: qty 50

## 2020-01-08 MED ORDER — CLINDAMYCIN HCL 300 MG PO CAPS
300.0000 mg | ORAL_CAPSULE | Freq: Four times a day (QID) | ORAL | 0 refills | Status: DC
Start: 2020-01-08 — End: 2021-07-09

## 2020-01-08 MED ORDER — SODIUM CHLORIDE 0.9 % IV BOLUS
1000.0000 mL | Freq: Once | INTRAVENOUS | Status: AC
  Administered 2020-01-08: 1000 mL via INTRAVENOUS

## 2020-01-08 MED ORDER — MORPHINE SULFATE (PF) 4 MG/ML IV SOLN
4.0000 mg | Freq: Once | INTRAVENOUS | Status: AC
  Administered 2020-01-08: 4 mg via INTRAVENOUS
  Filled 2020-01-08: qty 1

## 2020-01-08 NOTE — ED Triage Notes (Signed)
Facial swelling for 3 days.  Related to tooth on top right side.  Started on augmentin and viscous lidocaine last night (seen at [facility] urgent care).  Facial swelling worse today.

## 2020-01-08 NOTE — ED Notes (Signed)
In to discharge pt, pt had already pulled out IV and was throwing it in the trash. Pt had self discontinued the IVF as well. Pt took papers and walked out before signature was obtained.

## 2020-01-08 NOTE — ED Provider Notes (Signed)
[facility] Emergency Department Provider Note  ____________________________________________  Time seen: Approximately 3:26 PM  I have reviewed the triage vital signs and the nursing notes.   HISTORY  Chief Complaint Facial Swelling    HPI [name] [name] is a 24 y.o. female who presents the emergency department complaining of increasing facial pain, edema and erythema.  Patient was seen in urgent care yesterday, diagnosed with dental infection and started on Augmentin.  Patient has had 3 dose of Augmentin but states that the swelling is gone from her cheek to surrounding the left eye.  She states that the swelling is significant enough that she cannot open the left eye.  Patient has had no fevers or chills.  She has reporting pain in the left dental region.   No difficulty breathing or swallowing.  Patient has been using viscous lidocaine for symptom relief.  Patient denies any other complaints at this time.        Past Medical History:  Diagnosis Date  . 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 Anemia   . Headache(784.0)    otc med prn  . Infection   . Missed abortion 2012, 2014   x 2  . Preterm labor     Patient Active Problem List   Diagnosis Date Noted  . H/O: cesarean section 10/29/2013  . Hypokalemia 04/05/2012    Past Surgical History:  Procedure Laterality Date  . CESAREAN SECTION  04/2010   at 36 wks  . CESAREAN SECTION N/A 05/16/2012   Procedure: CESAREAN SECTION;  Surgeon: Bing Plume, MD;  Location: [facility];  Service: Obstetrics;  Laterality: N/A;  repeat   . CESAREAN SECTION N/A 10/29/2013   Procedure: CESAREAN SECTION;  Surgeon: Bing Plume, MD;  Location: [facility];  Service: Obstetrics;  Laterality: N/A;    Prior to Admission medications   Medication Sig Start Date End Date Taking? Authorizing Provider  amoxicillin-clavulanate (AUGMENTIN) 875-125 MG tablet Take 1 tablet by mouth every 12 (twelve) hours. 01/07/20    Particia Nearing, PA-C  clindamycin (CLEOCIN) 300 MG capsule Take 1 capsule (300 mg total) by mouth 4 (four) times daily. 01/08/20   [place] Brogden, Delorise Royals, PA-C  ibuprofen (ADVIL,MOTRIN) 600 MG tablet Take 1 tablet (600 mg total) by mouth every 6 (six) hours as needed for mild pain. 11/01/13   Tracey Harries, MD  lidocaine (XYLOCAINE) 2 % solution Use as directed 10 mLs in the mouth or throat as needed for mouth pain. 01/07/20   Particia Nearing, PA-C  naproxen (NAPROSYN) 500 MG tablet Take 1 tablet (500 mg total) by mouth 2 (two) times daily as needed for moderate pain. 08/19/19   Lorelee New, PA-C  oxyCODONE-acetaminophen (PERCOCET/ROXICET) 5-325 MG tablet Take 2 tablets by mouth every 8 (eight) hours as needed for severe pain. 08/19/19   Lorelee New, PA-C  oxyCODONE-acetaminophen (PERCOCET/ROXICET) 5-325 MG tablet Take 1 tablet by mouth every 6 (six) hours as needed for severe pain. 01/08/20   Linley Moskal, Delorise Royals, PA-C  Prenatal Vit-Fe Fumarate-FA (PRENATAL MULTIVITAMIN) TABS tablet Take 1 tablet by mouth daily.    [provider]  traMADol (ULTRAM) 50 MG tablet Take 1 tablet (50 mg total) by mouth every 12 (twelve) hours as needed. 01/07/20   Particia Nearing, PA-C    Allergies Vicodin [hydrocodone-acetaminophen]  Family History  Problem Relation Age of Onset  . Alcohol abuse Mother   . Diabetes Maternal Grandmother   .  Heart disease Maternal Grandmother   . Hyperlipidemia Maternal Grandmother   . Hypertension Maternal Grandmother   . Cancer Maternal Grandmother   . COPD Maternal Grandmother   . Arthritis Maternal Grandmother   . Alcohol abuse Maternal Grandfather     Social History Social History   Tobacco Use  . Smoking status: Current Every Day Smoker    Packs/day: 0.25    Years: 7.00    Pack years: 1.75    Types: Cigarettes  . Smokeless tobacco: Never Used  Substance Use Topics  . Alcohol use: No    Alcohol/week: 1.0 standard drink     Types: 1 Glasses of wine per week  . Drug use: No     Review of Systems  Constitutional: No fever/chills Eyes: No visual changes. No discharge ENT: Left upper dental pain with erythema and edema extending from the cheek to the left eye.  Swelling around the left eye is significant enough that patient cannot open the eye at this time. Cardiovascular: no chest pain. Respiratory: no cough. No SOB. Gastrointestinal: No abdominal pain.  No nausea, no vomiting.  No diarrhea.  No constipation. Musculoskeletal: Negative for musculoskeletal pain. Skin: Negative for rash, abrasions, lacerations, ecchymosis. Neurological: Negative for headaches, focal weakness or numbness.  10 System ROS otherwise negative.  ____________________________________________   PHYSICAL EXAM:  VITAL SIGNS: ED Triage Vitals  Enc Vitals Group     BP 01/08/20 1339 (!) 121/102     Pulse Rate 01/08/20 1339 (!) 110     Resp 01/08/20 1339 20     Temp 01/08/20 1339 99.8 F (37.7 C)     Temp Source 01/08/20 1339 Oral     SpO2 01/08/20 1339 99 %     Weight 01/08/20 1341 163 lb 2.3 oz (74 kg)     Height 01/08/20 1341 5\' 2"  (1.575 m)     Head Circumference --      Peak Flow --      Pain Score 01/08/20 1340 10     Pain Loc --      Pain Edu? --      Excl. in GC? --      Constitutional: Alert and oriented. Well appearing and in no acute distress. Eyes: Conjunctivae are normal. PERRL. EOMI. Head: Atraumatic. ENT:      Ears:       Nose: No congestion/rhinnorhea.      Mouth/Throat: Mucous membranes are moist.  Poor dentition throughout with multiple caries, multiple erosions.  Patient does have some erythema along the left upper gumline consistent with significant gingivitis.  There does not appear to be any appreciable drainable fluid collection identified along the gumline or cheek.  Patient has significant tenderness throughout the left upper gumline with palpation of tongue depressor.  Visible significant edema and  erythema of the cheek extending up into the left orbit.  Palpation reveals significant tenderness, there is induration but no fluctuance. Neck: No stridor.    Cardiovascular: Normal rate, regular rhythm. Normal S1 and S2.  Good peripheral circulation. Respiratory: Normal respiratory effort without tachypnea or retractions. Lungs CTAB. Good air entry to the bases with no decreased or absent breath sounds. Musculoskeletal: Full range of motion to all extremities. No gross deformities appreciated. Neurologic:  Normal speech and language. No gross focal neurologic deficits are appreciated.  Skin:  Skin is warm, dry and intact. No rash noted. Psychiatric: Mood and affect are normal. Speech and behavior are normal. Patient exhibits appropriate insight and judgement.   ____________________________________________  LABS (all labs ordered are listed, but only abnormal results are displayed)  Labs Reviewed  COMPREHENSIVE METABOLIC PANEL - Abnormal; Notable for the following components:      Result Value   Glucose, Bld 125 (*)    Creatinine, Ser 1.03 (*)    All other components within normal limits  CBC WITH DIFFERENTIAL/PLATELET - Abnormal; Notable for the following components:   WBC 11.1 (*)    All other components within normal limits   ____________________________________________  EKG   ____________________________________________  RADIOLOGY I personally viewed and evaluated these images as part of my medical decision making, as well as reviewing the written report by the radiologist.  ED Provider Interpretation: I concur with radiologist finding of soft tissue stranding consistent with facial cellulitis likely off of periapical abscess.  No defined fluid collection amenable to drainage  CT Maxillofacial W Contrast  Result Date: 01/08/2020 CLINICAL DATA:  Cellulitis.  Left-sided pain, redness, swelling. EXAM: CT MAXILLOFACIAL WITH CONTRAST TECHNIQUE: Multidetector CT imaging of the  maxillofacial structures was performed with intravenous contrast. Multiplanar CT image reconstructions were also generated. CONTRAST:  31mL OMNIPAQUE IOHEXOL 300 MG/ML  SOLN COMPARISON:  None. FINDINGS: Osseous: No fracture or mandibular dislocation. No destructive process. Orbits: There is left preseptal soft tissue stranding. No evidence of postseptal inflammatory change. The globes are symmetric and within normal limits. Sinuses: Mild inferior left maxillary sinus mucosal thickening. Soft tissues: Stranding of the left cheek and the left infraorbital subcutaneous soft tissues, compatible with cellulitis. No discrete drainable fluid collection. Other: Left lateral maxillary periapical lucency without evidence of overlying dehiscence or subperiosteal abscess. Left greater than right submandibular and cervical chain adenopathy, possibly reactive given infectious findings in the face. Limited intracranial: No significant or unexpected finding. IMPRESSION: 1. Stranding of the left cheek and the left infraorbital subcutaneous soft tissues, compatible with cellulitis. No discrete drainable fluid collection or evidence of postseptal cellulitis. 2. Findings may be related to a left maxillary lateral incisor periapical lucency, although there is no evidence of overlying cortical dehiscence or subperiosteal abscess. 3. Mild inferior left maxillary sinus mucosal thickening, which may be odontogenic in origin. Electronically Signed   By: Feliberto Harts MD   On: 01/08/2020 16:46    ____________________________________________    PROCEDURES  Procedure(s) performed:    Procedures    Medications  sodium chloride 0.9 % bolus 1,000 mL (0 mLs Intravenous Stopped 01/08/20 1741)  clindamycin (CLEOCIN) IVPB 600 mg (0 mg Intravenous Stopped 01/08/20 1632)  morphine 4 MG/ML injection 4 mg (4 mg Intravenous Given 01/08/20 1601)  iohexol (OMNIPAQUE) 300 MG/ML solution 75 mL (75 mLs Intravenous Contrast Given 01/08/20  1613)     ____________________________________________   INITIAL IMPRESSION / ASSESSMENT AND PLAN / ED COURSE  Pertinent labs & imaging results that were available during my care of the patient were reviewed by me and considered in my medical decision making (see chart for details).  Review of the [place] CSRS was performed in accordance of the [facility] prior to dispensing any controlled drugs.           Patient's diagnosis is consistent with dental infection and facial cellulitis.  Patient presented to the emergency department with worsening facial edema, erythema and pain.  Patient was diagnosed with a dental infection yesterday and started on Augmentin.  Patient has had 3 dose of antibiotic but the erythema and edema had extended from her cheek into the periorbital region.  Labs are reassuring the patient had imaging to ensure no deep  underlying abscess requiring drainage.  No evidence of loculated fluid collection requiring drainage.  Findings are consistent with dental infection and associated facial cellulitis.  Patient will stop the Augmentin, may transition to clindamycin.  IV clindamycin administered here in the emergency department..  Return precautions discussed with the patient.  Patient will be prescribed clindamycin and Percocet.  Patient had been prescribed Percocet from urgent care, states that she has used the provided prescription.  Given the amount of edema, infection I feel that further pain medication is warranted.  I attempted to look at the patient in the controlled substance database but on 3 attempts I have received error messages from controlled substance database where I could not access patient's records.  I assume as patient had been prescribed pain medication from urgent care 2 days ago that patient would be a candidate for pain medication and as such I prescribed 2 days worth of Percocet.  Patient is given ED precautions to return to the ED for any worsening or new  symptoms.     ____________________________________________  FINAL CLINICAL IMPRESSION(S) / ED DIAGNOSES  Final diagnoses:  Dental abscess  Facial cellulitis      NEW MEDICATIONS STARTED DURING THIS VISIT:  ED Discharge Orders         Ordered    clindamycin (CLEOCIN) 300 MG capsule  4 times daily        01/08/20 1739    oxyCODONE-acetaminophen (PERCOCET/ROXICET) 5-325 MG tablet  Every 6 hours PRN        01/08/20 1739              This chart was dictated using voice recognition software/Dragon. Despite best efforts to proofread, errors can occur which can change the meaning. Any change was purely unintentional.    Racheal Patches, PA-C 01/08/20 1745    Sharyn Creamer, MD 01/09/20 0030

## 2020-02-20 ENCOUNTER — Emergency Department (HOSPITAL_COMMUNITY): Payer: Medicaid Other

## 2020-02-20 ENCOUNTER — Emergency Department (HOSPITAL_COMMUNITY)
Admission: EM | Admit: 2020-02-20 | Discharge: 2020-02-21 | Disposition: A | Discharge status: Home / Self Care | Payer: Medicaid Other | Attending: Emergency Medicine | Admitting: Emergency Medicine

## 2020-02-20 ENCOUNTER — Encounter (HOSPITAL_COMMUNITY): Payer: Self-pay | Admitting: Emergency Medicine

## 2020-02-20 ENCOUNTER — Other Ambulatory Visit: Payer: Self-pay

## 2020-02-20 DIAGNOSIS — R200 Anesthesia of skin: Secondary | ICD-10-CM | POA: Insufficient documentation

## 2020-02-20 DIAGNOSIS — R0602 Shortness of breath: Secondary | ICD-10-CM | POA: Diagnosis not present

## 2020-02-20 DIAGNOSIS — Z20822 Contact with and (suspected) exposure to covid-19: Secondary | ICD-10-CM | POA: Insufficient documentation

## 2020-02-20 DIAGNOSIS — Z5321 Procedure and treatment not carried out due to patient leaving prior to being seen by health care provider: Secondary | ICD-10-CM | POA: Diagnosis not present

## 2020-02-20 LAB — CBC
HCT: 46.3 % — ABNORMAL HIGH (ref 36.0–46.0)
Hemoglobin: 15.9 g/dL — ABNORMAL HIGH (ref 12.0–15.0)
MCH: 32.2 pg (ref 26.0–34.0)
MCHC: 34.3 g/dL (ref 30.0–36.0)
MCV: 93.7 fL (ref 80.0–100.0)
Platelets: 184 10*3/uL (ref 150–400)
RBC: 4.94 MIL/uL (ref 3.87–5.11)
RDW: 12.3 % (ref 11.5–15.5)
WBC: 12.7 10*3/uL — ABNORMAL HIGH (ref 4.0–10.5)
nRBC: 0 % (ref 0.0–0.2)

## 2020-02-20 LAB — BASIC METABOLIC PANEL
Anion gap: 12 (ref 5–15)
BUN: 7 mg/dL (ref 6–20)
CO2: 23 mmol/L (ref 22–32)
Calcium: 9.4 mg/dL (ref 8.9–10.3)
Chloride: 100 mmol/L (ref 98–111)
Creatinine, Ser: 0.97 mg/dL (ref 0.44–1.00)
GFR, Estimated: >60 mL/min (ref >60)
Glucose, Bld: 120 mg/dL — ABNORMAL HIGH (ref 70–99)
Potassium: 3.9 mmol/L (ref 3.5–5.1)
Sodium: 135 mmol/L (ref 135–145)

## 2020-02-20 LAB — RESP PANEL BY RT-PCR (FLU A&B, COVID) ARPGX2
Influenza A by PCR: NEGATIVE
Influenza B by PCR: NEGATIVE
SARS Coronavirus 2 by RT PCR: NEGATIVE

## 2020-02-20 MED ORDER — ACETAMINOPHEN 325 MG PO TABS
650.0000 mg | ORAL_TABLET | Freq: Once | ORAL | Status: AC | PRN
  Administered 2020-02-20: 650 mg via ORAL
  Filled 2020-02-20: qty 2

## 2020-02-20 NOTE — ED Triage Notes (Signed)
Pt states when I breaths in my lungs feel like they are on fire and my mouth is numb.

## 2020-02-20 NOTE — ED Notes (Signed)
No answer when called for triage room

## 2020-02-21 MED ORDER — ACETAMINOPHEN 325 MG PO TABS
650.0000 mg | ORAL_TABLET | Freq: Once | ORAL | Status: DC
  Filled 2020-02-21: qty 2

## 2020-02-21 NOTE — ED Notes (Signed)
Pt called x2 for room, no answer.  

## 2020-11-10 ENCOUNTER — Other Ambulatory Visit: Payer: Self-pay

## 2020-11-10 ENCOUNTER — Encounter (HOSPITAL_COMMUNITY): Payer: Self-pay | Admitting: Oncology

## 2020-11-10 ENCOUNTER — Emergency Department (HOSPITAL_COMMUNITY)
Admission: EM | Admit: 2020-11-10 | Discharge: 2020-11-10 | Disposition: A | Discharge status: Home / Self Care | Payer: Medicaid Other | Attending: Emergency Medicine | Admitting: Emergency Medicine

## 2020-11-10 DIAGNOSIS — H66001 Acute suppurative otitis media without spontaneous rupture of ear drum, right ear: Secondary | ICD-10-CM | POA: Insufficient documentation

## 2020-11-10 DIAGNOSIS — F17210 Nicotine dependence, cigarettes, uncomplicated: Secondary | ICD-10-CM | POA: Insufficient documentation

## 2020-11-10 DIAGNOSIS — H9201 Otalgia, right ear: Secondary | ICD-10-CM | POA: Diagnosis present

## 2020-11-10 MED ORDER — AMOXICILLIN 500 MG PO CAPS: 500.0000 mg | ORAL_CAPSULE | Freq: Three times a day (TID) | ORAL | 0 refills | Status: DC

## 2020-11-10 NOTE — Discharge Instructions (Signed)
Take antibiotics as prescribed.  Take the entire course, even if symptoms improve. Use Tylenol or ibuprofen as needed for pain. Return to the emergency room with any new, worsening, or concerning symptoms.

## 2020-11-10 NOTE — ED Triage Notes (Signed)
Pt c/o right ear pain that started this morning.  

## 2020-11-10 NOTE — ED Provider Notes (Signed)
[facility]-EMERGENCY DEPT Provider Note   CSN: [id number] Arrival date & time: 11/10/20  1627     History Chief Complaint  Patient presents with  . Otalgia    [name] [name] is a 25 y.o. female presenting for right ear pain.  Patient states pain began today in her right ear.  No pain in the left.  No fevers.  Minimal nasal congestion.  No cough or shortness of breath.  Multiple family members are sick with viral sxs.  Has not taken anything for symptoms.  No medical problems, takes no medications daily.  HPI     Past Medical History:  Diagnosis Date  . 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 Anemia   . Headache(784.0)    otc med prn  . Infection   . Missed abortion 2012, 2014   x 2  . Preterm labor     Patient Active Problem List   Diagnosis Date Noted  . H/O: cesarean section 10/29/2013  . Hypokalemia 04/05/2012    Past Surgical History:  Procedure Laterality Date  . CESAREAN SECTION  04/2010   at 36 wks  . CESAREAN SECTION N/A 05/16/2012   Procedure: CESAREAN SECTION;  Surgeon: Bing Plume, MD;  Location: [facility];  Service: Obstetrics;  Laterality: N/A;  repeat   . CESAREAN SECTION N/A 10/29/2013   Procedure: CESAREAN SECTION;  Surgeon: Bing Plume, MD;  Location: [facility];  Service: Obstetrics;  Laterality: N/A;     OB History     Gravida  6   Para  3   Term  2   Preterm  1   AB  2   Living  3      SAB  2   IAB  0   Ectopic  0   Multiple  0   Live Births  3           Family History  Problem Relation Age of Onset  . Alcohol abuse Mother   . Diabetes Maternal Grandmother   . Heart disease Maternal Grandmother   . Hyperlipidemia Maternal Grandmother   . Hypertension Maternal Grandmother   . Cancer Maternal Grandmother   . COPD Maternal Grandmother   . Arthritis Maternal Grandmother   . Alcohol abuse Maternal Grandfather     Social History   Tobacco Use  . Smoking status: Every Day     Packs/day: 0.25    Years: 7.00    Pack years: 1.75    Types: Cigarettes  . Smokeless tobacco: Never  Substance Use Topics  . Alcohol use: No    Alcohol/week: 1.0 standard drink    Types: 1 Glasses of wine per week  . Drug use: No    Home Medications Prior to Admission medications   Medication Sig Start Date End Date Taking? Authorizing Provider  amoxicillin (AMOXIL) 500 MG capsule Take 1 capsule (500 mg total) by mouth 3 (three) times daily. 11/10/20  Yes Stephano Arrants, PA-C  amoxicillin-clavulanate (AUGMENTIN) 875-125 MG tablet Take 1 tablet by mouth every 12 (twelve) hours. 01/07/20   Particia Nearing, PA-C  clindamycin (CLEOCIN) 300 MG capsule Take 1 capsule (300 mg total) by mouth 4 (four) times daily. 01/08/20   Cuthriell, Delorise Royals, PA-C  ibuprofen (ADVIL,MOTRIN) 600 MG tablet Take 1 tablet (600 mg total) by mouth every 6 (six) hours as needed for mild pain. 11/01/13   Tracey Harries, MD  lidocaine (XYLOCAINE) 2 % solution Use  as directed 10 mLs in the mouth or throat as needed for mouth pain. 01/07/20   Particia Nearing, PA-C  naproxen (NAPROSYN) 500 MG tablet Take 1 tablet (500 mg total) by mouth 2 (two) times daily as needed for moderate pain. 08/19/19   Lorelee New, PA-C  oxyCODONE-acetaminophen (PERCOCET/ROXICET) 5-325 MG tablet Take 2 tablets by mouth every 8 (eight) hours as needed for severe pain. 08/19/19   Lorelee New, PA-C  oxyCODONE-acetaminophen (PERCOCET/ROXICET) 5-325 MG tablet Take 1 tablet by mouth every 6 (six) hours as needed for severe pain. 01/08/20   Cuthriell, Delorise Royals, PA-C  Prenatal Vit-Fe Fumarate-FA (PRENATAL MULTIVITAMIN) TABS tablet Take 1 tablet by mouth daily.    [provider]  traMADol (ULTRAM) 50 MG tablet Take 1 tablet (50 mg total) by mouth every 12 (twelve) hours as needed. 01/07/20   Particia Nearing, PA-C    Allergies    Vicodin [hydrocodone-acetaminophen]  Review of Systems   Review of Systems   Constitutional:  Negative for fever.  HENT:  Positive for ear pain.    Physical Exam Updated Vital Signs BP 120/88 (BP Location: Right Arm)   Pulse 85   Temp 98 F (36.7 C) (Oral)   Resp 16   Ht 5\' 3"  (1.6 m)   Wt 78.5 kg   LMP 11/10/2020 (Exact Date)   SpO2 100%   BMI 30.65 kg/m   Physical Exam Vitals and nursing note reviewed.  Constitutional:      General: She is not in acute distress.    Appearance: She is well-developed.  HENT:     Head: Normocephalic and atraumatic.     Right Ear: Tympanic membrane is injected, erythematous and bulging.     Left Ear: Tympanic membrane, ear canal and external ear normal.     Nose: No mucosal edema or congestion.  Eyes:     Extraocular Movements: Extraocular movements intact.  Cardiovascular:     Rate and Rhythm: Normal rate and regular rhythm.     Pulses: Normal pulses.  Pulmonary:     Effort: Pulmonary effort is normal.     Breath sounds: Normal breath sounds.     Comments: Clear lung sounds in all fields Abdominal:     General: There is no distension.  Musculoskeletal:        General: Normal range of motion.     Cervical back: Normal range of motion.  Skin:    General: Skin is warm.     Findings: No rash.  Neurological:     Mental Status: She is alert and oriented to person, place, and time.    ED Results / Procedures / Treatments   Labs (all labs ordered are listed, but only abnormal results are displayed) Labs Reviewed - No data to display  EKG None  Radiology No results found.  Procedures Procedures   Medications Ordered in ED Medications - No data to display  ED Course  I have reviewed the triage vital signs and the nursing notes.  Pertinent labs & imaging results that were available during my care of the patient were reviewed by me and considered in my medical decision making (see chart for details).    MDM Rules/Calculators/A&P                           Patient presenting for evaluation of  right ear pain.  On exam, TM is erythematous and bulging.  Consistent with acute otitis  media.  No pain of the mastoid.  Patient is overall well-appearing.  Discussed findings with patient and treatment antibiotics.  At this time, patient appears safe for discharge.  Return precautions given.  Patient states she understands and agrees to plan  Final Clinical Impression(s) / ED Diagnoses Final diagnoses:  Acute suppurative otitis media of right ear without spontaneous rupture of tympanic membrane, recurrence not specified    Rx / DC Orders ED Discharge Orders          Ordered    amoxicillin (AMOXIL) 500 MG capsule  3 times daily        11/10/20 1730             Alveria Apley, PA-C 11/10/20 1732    Wynetta Fines, MD 11/10/20 [phone]

## 2021-07-09 ENCOUNTER — Emergency Department (HOSPITAL_COMMUNITY)
Admission: EM | Admit: 2021-07-09 | Discharge: 2021-07-09 | Disposition: A | Discharge status: Home / Self Care | Payer: Medicaid Other | Attending: Emergency Medicine | Admitting: Emergency Medicine

## 2021-07-09 ENCOUNTER — Emergency Department (HOSPITAL_COMMUNITY): Payer: Medicaid Other

## 2021-07-09 ENCOUNTER — Encounter (HOSPITAL_COMMUNITY): Payer: Self-pay

## 2021-07-09 DIAGNOSIS — F17200 Nicotine dependence, unspecified, uncomplicated: Secondary | ICD-10-CM | POA: Insufficient documentation

## 2021-07-09 DIAGNOSIS — N3000 Acute cystitis without hematuria: Secondary | ICD-10-CM | POA: Insufficient documentation

## 2021-07-09 DIAGNOSIS — D72829 Elevated white blood cell count, unspecified: Secondary | ICD-10-CM | POA: Diagnosis not present

## 2021-07-09 DIAGNOSIS — H6690 Otitis media, unspecified, unspecified ear: Secondary | ICD-10-CM

## 2021-07-09 DIAGNOSIS — K089 Disorder of teeth and supporting structures, unspecified: Secondary | ICD-10-CM | POA: Insufficient documentation

## 2021-07-09 DIAGNOSIS — J020 Streptococcal pharyngitis: Secondary | ICD-10-CM | POA: Diagnosis not present

## 2021-07-09 DIAGNOSIS — H6691 Otitis media, unspecified, right ear: Secondary | ICD-10-CM | POA: Insufficient documentation

## 2021-07-09 DIAGNOSIS — R779 Abnormality of plasma protein, unspecified: Secondary | ICD-10-CM | POA: Insufficient documentation

## 2021-07-09 DIAGNOSIS — M545 Low back pain, unspecified: Secondary | ICD-10-CM | POA: Insufficient documentation

## 2021-07-09 DIAGNOSIS — Z20822 Contact with and (suspected) exposure to covid-19: Secondary | ICD-10-CM | POA: Diagnosis not present

## 2021-07-09 DIAGNOSIS — R739 Hyperglycemia, unspecified: Secondary | ICD-10-CM | POA: Insufficient documentation

## 2021-07-09 DIAGNOSIS — H9201 Otalgia, right ear: Secondary | ICD-10-CM | POA: Diagnosis present

## 2021-07-09 LAB — CBC WITH DIFFERENTIAL/PLATELET
Abs Immature Granulocytes: 0.1 10*3/uL — ABNORMAL HIGH (ref 0.00–0.07)
Basophils Absolute: 0 10*3/uL (ref 0.0–0.1)
Basophils Relative: 0 %
Eosinophils Absolute: 0 10*3/uL (ref 0.0–0.5)
Eosinophils Relative: 0 %
HCT: 46.8 % — ABNORMAL HIGH (ref 36.0–46.0)
Hemoglobin: 16.1 g/dL — ABNORMAL HIGH (ref 12.0–15.0)
Immature Granulocytes: 1 %
Lymphocytes Relative: 9 %
Lymphs Abs: 1.3 10*3/uL (ref 0.7–4.0)
MCH: 31.8 pg (ref 26.0–34.0)
MCHC: 34.4 g/dL (ref 30.0–36.0)
MCV: 92.3 fL (ref 80.0–100.0)
Monocytes Absolute: 0.7 10*3/uL (ref 0.1–1.0)
Monocytes Relative: 5 %
Neutro Abs: 12.3 10*3/uL — ABNORMAL HIGH (ref 1.7–7.7)
Neutrophils Relative %: 85 %
Platelets: 196 10*3/uL (ref 150–400)
RBC: 5.07 MIL/uL (ref 3.87–5.11)
RDW: 12.9 % (ref 11.5–15.5)
WBC: 14.4 10*3/uL — ABNORMAL HIGH (ref 4.0–10.5)
nRBC: 0 % (ref 0.0–0.2)

## 2021-07-09 LAB — SARS CORONAVIRUS 2 BY RT PCR: SARS Coronavirus 2 by RT PCR: NEGATIVE

## 2021-07-09 LAB — URINALYSIS, ROUTINE W REFLEX MICROSCOPIC
Bilirubin Urine: NEGATIVE
Glucose, UA: NEGATIVE mg/dL
Ketones, ur: NEGATIVE mg/dL
Nitrite: POSITIVE — AB
Protein, ur: 100 mg/dL — AB
Specific Gravity, Urine: 1.02 (ref 1.005–1.030)
pH: 5 (ref 5.0–8.0)

## 2021-07-09 LAB — COMPREHENSIVE METABOLIC PANEL
ALT: 18 U/L (ref 0–44)
AST: 20 U/L (ref 15–41)
Albumin: 4.5 g/dL (ref 3.5–5.0)
Alkaline Phosphatase: 78 U/L (ref 38–126)
Anion gap: 9 (ref 5–15)
BUN: 8 mg/dL (ref 6–20)
CO2: 22 mmol/L (ref 22–32)
Calcium: 9.3 mg/dL (ref 8.9–10.3)
Chloride: 104 mmol/L (ref 98–111)
Creatinine, Ser: 0.83 mg/dL (ref 0.44–1.00)
GFR, Estimated: >60 mL/min (ref >60)
Glucose, Bld: 124 mg/dL — ABNORMAL HIGH (ref 70–99)
Potassium: 3.7 mmol/L (ref 3.5–5.1)
Sodium: 135 mmol/L (ref 135–145)
Total Bilirubin: 0.8 mg/dL (ref 0.3–1.2)
Total Protein: 8.7 g/dL — ABNORMAL HIGH (ref 6.5–8.1)

## 2021-07-09 LAB — GROUP A STREP BY PCR: Group A Strep by PCR: DETECTED — AB

## 2021-07-09 LAB — PREGNANCY, URINE: Preg Test, Ur: NEGATIVE

## 2021-07-09 MED ORDER — AMOXICILLIN-POT CLAVULANATE 875-125 MG PO TABS
1.0000 | ORAL_TABLET | Freq: Once | ORAL | Status: AC
  Administered 2021-07-09: 1 via ORAL
  Filled 2021-07-09: qty 1

## 2021-07-09 MED ORDER — LIDOCAINE 5 % EX PTCH
2.0000 | MEDICATED_PATCH | CUTANEOUS | Status: DC
  Administered 2021-07-09: 2 via TRANSDERMAL
  Filled 2021-07-09: qty 2

## 2021-07-09 MED ORDER — LIDOCAINE 5 % EX PTCH: 1.0000 | MEDICATED_PATCH | CUTANEOUS | 0 refills | Status: AC

## 2021-07-09 MED ORDER — METHOCARBAMOL 500 MG PO TABS
500.0000 mg | ORAL_TABLET | Freq: Once | ORAL | Status: AC
  Administered 2021-07-09: 500 mg via ORAL
  Filled 2021-07-09: qty 1

## 2021-07-09 MED ORDER — KETOROLAC TROMETHAMINE 15 MG/ML IJ SOLN
15.0000 mg | Freq: Once | INTRAMUSCULAR | Status: AC
  Administered 2021-07-09: 15 mg via INTRAMUSCULAR
  Filled 2021-07-09: qty 1

## 2021-07-09 MED ORDER — AMOXICILLIN-POT CLAVULANATE 875-125 MG PO TABS: 1.0000 | ORAL_TABLET | Freq: Two times a day (BID) | ORAL | 0 refills | Status: DC

## 2021-07-09 MED ORDER — METHOCARBAMOL 500 MG PO TABS: 500.0000 mg | ORAL_TABLET | Freq: Two times a day (BID) | ORAL | 0 refills | Status: AC

## 2021-07-09 MED ORDER — ACETAMINOPHEN 325 MG PO TABS
650.0000 mg | ORAL_TABLET | Freq: Once | ORAL | Status: AC
  Administered 2021-07-09: 650 mg via ORAL
  Filled 2021-07-09: qty 2

## 2021-07-09 MED ORDER — LACTATED RINGERS IV BOLUS: 1000.0000 mL | Freq: Once | INTRAVENOUS | Status: DC

## 2021-07-09 NOTE — ED Triage Notes (Signed)
Patient c/o upper front dental pain x 2-3 days. Patient states she got her teeth knocked out 3 years ago.  Patient also c/o bilateral lower back pain and states she does not recall any specific injury or heavy lifting. Patient also c/o right ear pain and states the pain radiates down to there tailbone.

## 2021-07-09 NOTE — ED Notes (Signed)
Pt refusing IV wants to drink fluids instead.

## 2021-07-09 NOTE — ED Provider Notes (Signed)
[facility]-EMERGENCY DEPT Provider Note   CSN: [id number] Arrival date & time: 07/09/21  1202     History Chief Complaint  Patient presents with   Otalgia   Back Pain   Dental Pain    [name] [name] is a 26 y.o. female presents the emergency department for evaluation of multiple complaints.  Patient presents with bilateral pain, right ear pain, rhinorrhea, nasal congestion, sore throat with a mild cough for the past 3 days.  She denies any dysuria or hematuria, abdominal pain, nausea, vomiting, urgency, frequency, fever, syncope.  She denies any chest pain or shortness of breath.  She informed triage that she had dental pain although she denies this with me.  No medications trialed prior to arrival.  She reports that she is still able to eat and drink.  She does have a history of ear infections and strep throat.  She denies any medical history.  Surgical history pertinent for 6 C-section and bilateral tubal ligation.  She denies any medications.  Allergic to medication.  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  She denies any trauma, falls, MVC's to her back.  Denies any heavy lifting.   Otalgia Associated symptoms: congestion, cough, rhinorrhea and sore throat   Associated symptoms: no abdominal pain, no diarrhea, no fever, no headaches, no neck pain and no vomiting   Back Pain Associated symptoms: no abdominal pain, no chest pain, no dysuria, no fever, no headaches and no weakness   Dental Pain Associated symptoms: congestion   Associated symptoms: no fever, no headaches and no neck pain       Home Medications Prior to Admission medications   Medication Sig Start Date End Date Taking? Authorizing Provider  amoxicillin (AMOXIL) 500 MG capsule Take 1 capsule (500 mg total) by mouth 3 (three) times daily. 11/10/20   Caccavale, Sophia, PA-C  amoxicillin-clavulanate (AUGMENTIN) 875-125 MG tablet Take 1 tablet by mouth every 12 (twelve) hours. 01/07/20    Particia Nearing, PA-C  clindamycin (CLEOCIN) 300 MG capsule Take 1 capsule (300 mg total) by mouth 4 (four) times daily. 01/08/20   Cuthriell, Delorise Royals, PA-C  ibuprofen (ADVIL,MOTRIN) 600 MG tablet Take 1 tablet (600 mg total) by mouth every 6 (six) hours as needed for mild pain. 11/01/13   Tracey Harries, MD  lidocaine (XYLOCAINE) 2 % solution Use as directed 10 mLs in the mouth or throat as needed for mouth pain. 01/07/20   Particia Nearing, PA-C  naproxen (NAPROSYN) 500 MG tablet Take 1 tablet (500 mg total) by mouth 2 (two) times daily as needed for moderate pain. 08/19/19   Lorelee New, PA-C  oxyCODONE-acetaminophen (PERCOCET/ROXICET) 5-325 MG tablet Take 2 tablets by mouth every 8 (eight) hours as needed for severe pain. 08/19/19   Lorelee New, PA-C  oxyCODONE-acetaminophen (PERCOCET/ROXICET) 5-325 MG tablet Take 1 tablet by mouth every 6 (six) hours as needed for severe pain. 01/08/20   Cuthriell, Delorise Royals, PA-C  Prenatal Vit-Fe Fumarate-FA (PRENATAL MULTIVITAMIN) TABS tablet Take 1 tablet by mouth daily.    [provider]  traMADol (ULTRAM) 50 MG tablet Take 1 tablet (50 mg total) by mouth every 12 (twelve) hours as needed. 01/07/20   Particia Nearing, PA-C      Allergies    Vicodin [hydrocodone-acetaminophen]    Review of Systems   Review of Systems  Constitutional:  Negative for chills and fever.  HENT:  Positive for congestion, ear pain, rhinorrhea and sore throat.  Negative for dental problem and trouble swallowing.   Respiratory:  Positive for cough. Negative for shortness of breath.   Cardiovascular:  Negative for chest pain.  Gastrointestinal:  Negative for abdominal pain, constipation, diarrhea, nausea and vomiting.  Genitourinary:  Negative for dysuria, frequency, hematuria and urgency.  Musculoskeletal:  Positive for back pain. Negative for neck pain.  Neurological:  Negative for syncope, weakness and headaches.   Physical Exam Updated  Vital Signs BP 110/79 (BP Location: Right Arm)   Pulse (!) 106   Temp 98.4 F (36.9 C) (Oral)   Resp 18   Ht 5\' 2"  (1.575 m)   Wt 83.9 kg   LMP 07/09/2021 (Approximate)   SpO2 98%   BMI 33.84 kg/m  Physical Exam Vitals and nursing note reviewed.  Constitutional:      General: She is not in acute distress.    Appearance: She is not ill-appearing or toxic-appearing.  HENT:     Head: Normocephalic and atraumatic.     Right Ear: Tympanic membrane, ear canal and external ear normal.     Left Ear: Tympanic membrane, ear canal and external ear normal.     Ears:     Comments: Purulent right TM intact.  No external ear canal edema.  Consistent with right otitis media.  No mastoid tenderness or swelling.    Nose: Congestion present.     Comments: Bilateral nasal turbinate edema and erythema with scant clear nasal discharge.    Mouth/Throat:     Mouth: Mucous membranes are moist.     Pharynx: Posterior oropharyngeal erythema present. No oropharyngeal exudate.     Comments: No pharyngeal  exudate, or edema noted.  Mild pharyngeal erythema.  Uvula midline.  Airway patent.  Moist mucous membranes.  Extremely poor dentition with multiple caries and broken rotting teeth Eyes:     General: No scleral icterus.    Conjunctiva/sclera: Conjunctivae normal.  Neck:     Comments: Full range of motion without pain.  No cervical lymphadenopathy.  No neck rigidity. Cardiovascular:     Rate and Rhythm: Tachycardia present.  Pulmonary:     Effort: Pulmonary effort is normal. No respiratory distress.     Breath sounds: Normal breath sounds.  Abdominal:     General: Abdomen is flat.     Palpations: Abdomen is soft.     Tenderness: There is no abdominal tenderness. There is no right CVA tenderness, left CVA tenderness, guarding or rebound.  Musculoskeletal:        General: No deformity.     Cervical back: Normal range of motion. No rigidity or tenderness.     Comments: Mild bilateral paraspinal lower  lumbar tenderness palpation without any overlying skin changes, swelling, increased warmth.  No midline cervical, thoracic, or lumbar tenderness palpation.  Lymphadenopathy:     Cervical: No cervical adenopathy.  Skin:    Findings: No rash.  Neurological:     General: No focal deficit present.     Mental Status: She is alert.    ED Results / Procedures / Treatments   Labs (all labs ordered are listed, but only abnormal results are displayed) Labs Reviewed  GROUP A STREP BY PCR - Abnormal; Notable for the following components:      Result Value   Group A Strep by PCR DETECTED (*)    All other components within normal limits  URINE CULTURE - Abnormal; Notable for the following components:   Culture >=100,000 COLONIES/mL ESCHERICHIA COLI (*)    Organism  ID, Bacteria ESCHERICHIA COLI (*)    All other components within normal limits  CBC WITH DIFFERENTIAL/PLATELET - Abnormal; Notable for the following components:   WBC 14.4 (*)    Hemoglobin 16.1 (*)    HCT 46.8 (*)    Neutro Abs 12.3 (*)    Abs Immature Granulocytes 0.10 (*)    All other components within normal limits  COMPREHENSIVE METABOLIC PANEL - Abnormal; Notable for the following components:   Glucose, Bld 124 (*)    Total Protein 8.7 (*)    All other components within normal limits  URINALYSIS, ROUTINE W REFLEX MICROSCOPIC - Abnormal; Notable for the following components:   APPearance HAZY (*)    Hgb urine dipstick MODERATE (*)    Protein, ur 100 (*)    Nitrite POSITIVE (*)    Leukocytes,Ua SMALL (*)    Bacteria, UA MANY (*)    All other components within normal limits  SARS CORONAVIRUS 2 BY RT PCR  PREGNANCY, URINE    EKG EKG Interpretation  Date/Time:  Thursday Jul 09 2021 16:14:38 EDT Ventricular Rate:  74 PR Interval:  135 QRS Duration: 93 QT Interval:  387 QTC Calculation: 430 R Axis:   -4 Text Interpretation: Sinus rhythm Multiple premature complexes, vent & supraven Confirmed by Margarita Grizzle [id number])  on 07/09/2021 4:32:27 PM  Radiology DG Chest 2 View  Result Date: 07/09/2021 CLINICAL DATA:  Chest pain EXAM: CHEST - 2 VIEW COMPARISON:  02/20/2020 FINDINGS: The heart size and mediastinal contours are within normal limits. Both lungs are clear. The visualized skeletal structures are unremarkable. IMPRESSION: No active cardiopulmonary disease. Electronically Signed   By: Marlan Palau M.D.   On: 07/09/2021 15:00   DG Lumbar Spine Complete  Result Date: 07/09/2021 CLINICAL DATA:  Low back pain. EXAM: LUMBAR SPINE - COMPLETE 4+ VIEW COMPARISON:  Lumbar spine radiographs 01/22/2013 FINDINGS: There is no evidence of lumbar spine fracture. Alignment is normal. Intervertebral disc spaces are maintained. IMPRESSION: Negative. Electronically Signed   By: Marlan Palau M.D.   On: 07/09/2021 14:59     Procedures Procedures    Medications Ordered in ED Medications  ketorolac (TORADOL) 15 MG/ML injection 15 mg (15 mg Intramuscular Given 07/09/21 1744)  methocarbamol (ROBAXIN) tablet 500 mg (500 mg Oral Given 07/09/21 1745)  amoxicillin-clavulanate (AUGMENTIN) 875-125 MG per tablet 1 tablet (1 tablet Oral Given 07/09/21 1745)  acetaminophen (TYLENOL) tablet 650 mg (650 mg Oral Given 07/09/21 1751)    ED Course/ Medical Decision Making/ A&P                           Medical Decision Making Amount and/or Complexity of Data Reviewed Labs: ordered. Radiology: ordered.  Risk OTC drugs. Prescription drug management.   26 year old female presents emergency department for evaluation of low back pain, ear pain, sore throat and rhinorrhea, nasal congestion for the past 3 days.  Differential diagnosis includes but is not limited to viral illness, COVID, flu, strep throat, otitis media, otitis externa, mastoiditis, sinusitis, UTI, cauda equina, MSK sprain, strain, fracture, pneumonia.  Signs show normotensive, afebrile, slightly tachycardic but on previous chart examination patient is tachycardic  frequently.  She is satting well on room air without any increased work of breathing.  Physical exam is pertinent for purulent right TM that is intact.  Consistent with right otitis media.  The patient does have some very mild pharyngeal erythema without any edema or exudate.  She has moist mucous membranes  and uvula is midline with airway patent.  No abdominal tenderness palpation.  Abdomen is soft.  No CVA tenderness bilaterally.  She is full range of motion of her neck without pain.  I independently reviewed and interpreted the patient's labs and imaging and agree with radiologist interpretation.  CBC shows leukocytosis at 14.4 as well as a increase in hemoglobin and hematocrit, could be likely dehydration with hemoconcentration.  CMP shows elevated glucose at 124 and elevated total protein 8.7 otherwise no electrolyte or LFT abnormalities.  Pregnancy test is negative.  She is negative for COVID.  She is positive for group A strep.  Urinalysis shows hazy urine with moderate amount of hemoglobin.  She is 100 protein but is nitrite positive with small leukocytes many bacteria with only 6-10 white blood cells seen.  The patient does not have any other urinary tract symptoms, however it does have the low back pain and is positive for nitrites.  Chest x-ray shows no acute cardiopulmonary process.  Lumbar x-ray is negative. Urine culture pending.  I doubt any fracture to her back given that this pain is atraumatic and is also in the bilateral paraspinal area.  Doubt any cauda equina as the patient denies that she has any urinary incontinence or fecal incontinence.  She denies any urinary retention or saddle anesthesia.  Patient does not meet qualification for sinusitis as patient is not febrile or has had symptoms for over 10 days with failed outpatient therapy.  Exam is not consistent with otitis externa but is consistent with right otitis media.  I discussed this case with pharmacist about treating for strep,  ear infection, as well as a UTI.  She discussed that the Augmentin should cover all of these well.  Still recommended that he obtain a urine culture.  I think this is reasonable.  I think the patient could benefit from some IV fluids to help with her hemoconcentration as well as urine appearing hazy, but the patient does not want an IV.  She is able to tolerate p.o. but is drinking and Dr. Mitzie NaEverett at bedside.  I encouraged her to drink fluids, mainly water instead to rehydrate her body.  Patient reports she may consider.  The patient was given IM Toradol, lidocaine patches, Robaxin, Tylenol, and her first dose of Augmentin while in the emergency department.  On reevaluation after the lidocaine, Toradol, and Robaxin, thankfully the patient reports that her back pain is significantly improved.  I offered to give the patient Bicillin for coverage of her strep, but she declined and would like oral antibiotics instead.  Will prescribe her the Augmentin to take for the next 10 days given the coverage for strep.  Going, we will send her home on the Robaxin and lidocaine patches as she felt that this helped with her lower back pain.  We discussed strict return precautions and red flag symptoms.  The patient verbalized understanding and agrees to plan.  Patient is stable and being discharged home in good condition.  I discussed this case with my attending physician who cosigned this note including patient's presenting symptoms, physical exam, and planned diagnostics and interventions. Attending physician stated agreement with plan or made changes to plan which were implemented.   Final Clinical Impression(s) / ED Diagnoses Final diagnoses:  Acute bilateral low back pain without sciatica  Acute otitis media, unspecified otitis media type  Strep pharyngitis  Acute cystitis without hematuria  Poor dentition    Rx / DC Orders ED Discharge Orders  Ordered    amoxicillin-clavulanate (AUGMENTIN)  875-125 MG tablet  Every 12 hours,   Status:  Discontinued        07/09/21 1738    amoxicillin-clavulanate (AUGMENTIN) 875-125 MG tablet  Every 12 hours        07/09/21 1814    lidocaine (LIDODERM) 5 %  Every 24 hours        07/09/21 1814    methocarbamol (ROBAXIN) 500 MG tablet  2 times daily        07/09/21 1835              Achille Rich, PA-C 07/13/21 2257    Margarita Grizzle, MD 07/15/21 [phone]

## 2021-07-09 NOTE — Discharge Instructions (Addendum)
You were seen in the emergency department for evaluation of your cough and cold symptoms, sore throat, and low back pain.  It was discovered that you have strep throat, an ear infection, as well as a UTI.  For these, I have placed you on Augmentin that she will take twice daily for the next 10 days.  Additionally, I have given you a prescription for the lidocaine patches.  If for what ever reason your insurance is not able to cover these, you can simply pick them up over-the-counter at your nearest pharmacy.  Additionally, you can take Tylenol and/or ibuprofen as needed for your pain.  Please make sure you are drinking plenty of water as your labs do indicate that you have some dehydration.  I have included some dental resources in for you to call to follow-up to be evaluated.  If you have any concern, new or worsening symptoms, please return to the nearest emergency department for reevaluation.  Contact a health care provider if: You have swelling in your neck that keeps getting bigger. You develop a rash, cough, or earache. You cough up a thick mucus that is green, yellow-brown, or bloody. You have pain or discomfort that does not get better with medicine. Your symptoms seem to be getting worse. You have a fever. Get help right away if: You have new symptoms, such as vomiting, severe headache, stiff or painful neck, chest pain, or shortness of breath. You have severe throat pain, drooling, or changes in your voice. You have swelling of the neck, or the skin on the neck becomes red and tender. You have signs of dehydration, such as tiredness (fatigue), dry mouth, and decreased urination. You become increasingly sleepy, or you cannot wake up completely. Your joints become red or painful. These symptoms may represent a serious problem that is an emergency. Do not wait to see if the symptoms will go away. Get medical help right away. Call your local emergency services (911 in the [place] Do not drive  yourself to the hospital.

## 2021-07-09 NOTE — ED Notes (Signed)
I attempted two twice to collect labs and was unsuccessful.

## 2021-07-11 LAB — URINE CULTURE: Culture: >=100000 — AB

## 2021-07-12 ENCOUNTER — Telehealth (HOSPITAL_BASED_OUTPATIENT_CLINIC_OR_DEPARTMENT_OTHER): Payer: Self-pay | Admitting: *Deleted

## 2021-07-12 NOTE — Telephone Encounter (Signed)
Post ED Visit - Positive Culture Follow-up: Unsuccessful Patient Follow-up  Culture assessed and recommendations reviewed by:  [x]  Anh Pham, Pharm.D. []  , Pharm.D., BCPS AQ-ID []  , Pharm.D., BCPS []  Celedonio Miyamoto, Pharm.D., BCPS []  [place], Garvin Fila.D., BCPS, AAHIVP []  , Pharm.D., BCPS, AAHIVP []  Georgina Pillion, PharmD []  , PharmD, BCPS  Positive urine culture  []  Patient discharged without antimicrobial prescription and treatment is now indicated [x]  Organism is resistant to prescribed ED discharge antimicrobial []  Patient with positive blood cultures  Plan: D/C Augmentin Start Keflex 500mg  PO BID x 10 days   Unable to contact patient  letter will be sent to address on file  [place] 07/12/2021, 2:21 PM

## 2021-07-12 NOTE — Progress Notes (Signed)
ED Antimicrobial Stewardship Positive Culture Follow Up   [name] [name] is an 26 y.o. female who presented to [facility] on 07/09/2021 with a chief complaint of  Chief Complaint  Patient presents with   Otalgia   Back Pain   Dental Pain    Recent Results (from the past 720 hour(s))  SARS Coronavirus 2 by RT PCR (hospital order, performed in [facility] hospital lab) *cepheid single result test* Anterior Nasal Swab     Status: None   Collection Time: 07/09/21  1:29 PM   Specimen: Anterior Nasal Swab  Result Value Ref Range Status   SARS Coronavirus 2 by RT PCR NEGATIVE NEGATIVE Final    Comment: (NOTE) SARS-CoV-2 target nucleic acids are NOT DETECTED.  The SARS-CoV-2 RNA is generally detectable in upper and lower respiratory specimens during the acute phase of infection. The lowest concentration of SARS-CoV-2 viral copies this assay can detect is 250 copies / mL. A negative result does not preclude SARS-CoV-2 infection and should not be used as the sole basis for treatment or other patient management decisions.  A negative result may occur with improper specimen collection / handling, submission of specimen other than nasopharyngeal swab, presence of viral mutation(s) within the areas targeted by this assay, and inadequate number of viral copies (<250 copies / mL). A negative result must be combined with clinical observations, patient history, and epidemiological information.  Fact Sheet for Patients:   RoadLapTop.co.za  Fact Sheet for Healthcare Providers: http://kim-miller.com/  This test is not yet approved or  cleared by the [place] FDA and has been authorized for detection and/or diagnosis of SARS-CoV-2 by FDA under an Emergency Use Authorization (EUA).  This EUA will remain in effect (meaning this test can be used) for the duration of the COVID-19 declaration under Section 564(b)(1) of the Act, 21 U.S.C. section  360bbb-3(b)(1), unless the authorization is terminated or revoked sooner.  Performed at [facility], [address] [place], [place] [address]   Group A Strep by PCR     Status: Abnormal   Collection Time: 07/09/21  1:29 PM   Specimen: Anterior Nasal Swab; Sterile Swab  Result Value Ref Range Status   Group A Strep by PCR DETECTED (A) NOT DETECTED Final    Comment: Performed at [facility], [address], [place], [place] [address]  Urine Culture     Status: Abnormal   Collection Time: 07/09/21  1:37 PM   Specimen: Urine, Clean Catch  Result Value Ref Range Status   Specimen Description   Final    URINE, CLEAN CATCH Performed at [facility], [address], [place], [place] [address]    Special Requests   Final    NONE Performed at [facility], [address] [place], [place] [address]    Culture >=100,000 COLONIES/mL ESCHERICHIA COLI (A)  Final   Report Status 07/11/2021 FINAL  Final   Organism ID, Bacteria ESCHERICHIA COLI (A)  Final      Susceptibility   Escherichia coli - MIC*    AMPICILLIN >=32 RESISTANT Resistant     CEFAZOLIN <=4 SENSITIVE Sensitive     CEFEPIME <=0.12 SENSITIVE Sensitive     CEFTRIAXONE <=0.25 SENSITIVE Sensitive     CIPROFLOXACIN <=0.25 SENSITIVE Sensitive     GENTAMICIN <=1 SENSITIVE Sensitive     IMIPENEM <=0.25 SENSITIVE Sensitive     NITROFURANTOIN <=16 SENSITIVE Sensitive     TRIMETH/SULFA <=20 SENSITIVE Sensitive  AMPICILLIN/SULBACTAM >=32 RESISTANT Resistant     PIP/TAZO <=4 SENSITIVE Sensitive     * >=100,000 COLONIES/mL ESCHERICHIA COLI    Plan: 1) d/c augmentin 2) start keflex 500 mg PO twice daily for 10 days   ED Provider: Theophilus Kinds, PA-C   Lucia Gaskins 07/12/2021, 8:53 AM Clinical Pharmacist [phone]

## 2022-03-04 ENCOUNTER — Emergency Department (HOSPITAL_COMMUNITY)
Admission: EM | Admit: 2022-03-04 | Discharge: 2022-03-04 | Disposition: A | Discharge status: Home / Self Care | Payer: Medicaid Other | Attending: Student | Admitting: Student

## 2022-03-04 ENCOUNTER — Other Ambulatory Visit: Payer: Self-pay

## 2022-03-04 ENCOUNTER — Encounter (HOSPITAL_COMMUNITY): Payer: Self-pay | Admitting: Emergency Medicine

## 2022-03-04 DIAGNOSIS — Z20822 Contact with and (suspected) exposure to covid-19: Secondary | ICD-10-CM | POA: Diagnosis not present

## 2022-03-04 DIAGNOSIS — R059 Cough, unspecified: Secondary | ICD-10-CM | POA: Diagnosis present

## 2022-03-04 DIAGNOSIS — J101 Influenza due to other identified influenza virus with other respiratory manifestations: Secondary | ICD-10-CM | POA: Insufficient documentation

## 2022-03-04 LAB — RESP PANEL BY RT-PCR (RSV, FLU A&B, COVID)  RVPGX2
Influenza A by PCR: NEGATIVE
Influenza B by PCR: POSITIVE — AB
Resp Syncytial Virus by PCR: NEGATIVE
SARS Coronavirus 2 by RT PCR: NEGATIVE

## 2022-03-04 MED ORDER — BENZONATATE 100 MG PO CAPS: 100.0000 mg | ORAL_CAPSULE | Freq: Three times a day (TID) | ORAL | 0 refills | Status: AC

## 2022-03-04 MED ORDER — IBUPROFEN 200 MG PO TABS
600.0000 mg | ORAL_TABLET | Freq: Once | ORAL | Status: AC
  Administered 2022-03-04: 600 mg via ORAL
  Filled 2022-03-04: qty 3

## 2022-03-04 MED ORDER — FLUTICASONE PROPIONATE 50 MCG/ACT NA SUSP: 2.0000 | Freq: Every day | NASAL | 0 refills | Status: AC

## 2022-03-04 NOTE — ED Triage Notes (Signed)
The patient presents due to what she believes is a URI. She has had a sick contact. Her mother had a URI. She complains of bilateral ear ache, fever and a productive cough. Symptoms started 2-3 days ago. OTC medication did not help.

## 2022-03-04 NOTE — ED Provider Notes (Signed)
[facility] DEPT Provider Note  CSN: [id number] Arrival date & time: 03/04/22 1312  Chief Complaint(s) Cough, Fever, and Otalgia  HPI [name] [name] is a 27 y.o. female who presents emergency department for evaluation of cough, fever and ear pain.  Patient states symptoms have been present for the last 2 to 3 days and has been taking outpatient over-the-counter flu medicine with no improvement.  Positive sick contact in mother had an "acute respiratory infection" earlier this week.  Denies abdominal pain, nausea, vomiting, diarrhea or other systemic symptoms.  Patient arrives febrile and tachycardic.   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 Authorizing Provider  amoxicillin-clavulanate (AUGMENTIN) 875-125 MG tablet Take 1 tablet by mouth every 12 (twelve) hours. 07/09/21   Sherrell Puller, PA-C  ibuprofen (ADVIL,MOTRIN) 600 MG tablet Take 1 tablet (600 mg total) by mouth every 6 (six) hours as needed for mild pain. 11/01/13   Newton Pigg, MD  lidocaine (LIDODERM) 5 % Place 1 patch onto the skin daily. Remove & Discard patch within 12 hours or as directed by MD 07/09/21   Sherrell Puller, PA-C  lidocaine (XYLOCAINE) 2 % solution Use as directed 10 mLs in the mouth or throat as needed for mouth pain. 01/07/20   Volney American, PA-C  methocarbamol (ROBAXIN) 500 MG tablet Take 1 tablet (500 mg total) by mouth 2 (two) times daily. 07/09/21   Sherrell Puller, PA-C  naproxen (NAPROSYN) 500 MG tablet Take 1 tablet (500 mg total) by mouth 2 (two) times daily as needed for  moderate pain. 08/19/19   Corena Herter, PA-C  oxyCODONE-acetaminophen (PERCOCET/ROXICET) 5-325 MG tablet Take 2 tablets by mouth every 8 (eight) hours as needed for severe pain. 08/19/19   Corena Herter, PA-C  oxyCODONE-acetaminophen (PERCOCET/ROXICET) 5-325 MG tablet Take 1 tablet by mouth every 6 (six) hours as needed for severe pain. 01/08/20   Cuthriell, Charline Bills, PA-C  Prenatal Vit-Fe Fumarate-FA (PRENATAL MULTIVITAMIN) TABS tablet Take 1 tablet by mouth daily.    [provider]  traMADol (ULTRAM) 50 MG tablet Take 1 tablet (50 mg total) by mouth every 12 (twelve) hours as needed. 01/07/20   Volney American, PA-C                                                                                                                                    Past Surgical History Past Surgical History:  Procedure  Laterality Date   CESAREAN SECTION  04/2010   at 36 wks   CESAREAN SECTION N/A 05/16/2012   Procedure: CESAREAN SECTION;  Surgeon: Melina Schools, MD;  Location: [facility];  Service: Obstetrics;  Laterality: N/A;  repeat    CESAREAN SECTION N/A 10/29/2013   Procedure: CESAREAN SECTION;  Surgeon: Melina Schools, MD;  Location: [facility];  Service: Obstetrics;  Laterality: N/A;   Family History Family History  Problem Relation Age of Onset   Alcohol abuse Mother    Diabetes Maternal Grandmother    Heart disease Maternal Grandmother    Hyperlipidemia Maternal Grandmother    Hypertension Maternal Grandmother    Cancer Maternal Grandmother    COPD Maternal Grandmother    Arthritis Maternal Grandmother    Alcohol abuse Maternal Grandfather     Social History Social History   Tobacco Use   Smoking status: Every Day    Packs/day: 0.25    Years: 7.00    Total pack years: 1.75    Types: Cigarettes   Smokeless tobacco: Never  Vaping Use   Vaping Use: Never used  Substance Use Topics   Alcohol use: No    Alcohol/week: 1.0 standard drink of alcohol    Types: 1 Glasses of  wine per week   Drug use: No   Allergies Vicodin [hydrocodone-acetaminophen]  Review of Systems Review of Systems  Constitutional:  Positive for chills and fever.  Respiratory:  Positive for cough.     Physical Exam Vital Signs  I have reviewed the triage vital signs BP 123/87 (BP Location: Left Arm)   Pulse (!) 118   Temp (!) 101.2 F (38.4 C) (Oral)   Resp 16   Wt 78.2 kg   SpO2 95%   BMI 31.53 kg/m   Physical Exam Vitals and nursing note reviewed.  Constitutional:      General: She is not in acute distress.    Appearance: She is well-developed.  HENT:     Head: Normocephalic and atraumatic.     Nose: Rhinorrhea present.  Eyes:     Conjunctiva/sclera: Conjunctivae normal.  Cardiovascular:     Rate and Rhythm: Normal rate and regular rhythm.     Heart sounds: No murmur heard. Pulmonary:     Effort: Pulmonary effort is normal. No respiratory distress.     Breath sounds: Normal breath sounds.  Abdominal:     Palpations: Abdomen is soft.     Tenderness: There is no abdominal tenderness.  Musculoskeletal:        General: No swelling.     Cervical back: Neck supple.  Skin:    General: Skin is warm and dry.     Capillary Refill: Capillary refill takes less than 2 seconds.  Neurological:     Mental Status: She is alert.  Psychiatric:        Mood and Affect: Mood normal.     ED Results and Treatments Labs (all labs ordered are listed, but only abnormal results are displayed) Labs Reviewed  RESP PANEL BY RT-PCR (RSV, FLU A&B, COVID)  RVPGX2  Radiology No results found.  Pertinent labs & imaging results that were available during my care of the patient were reviewed by me and considered in my medical decision making (see MDM for details).  Medications Ordered in ED Medications  ibuprofen (ADVIL) tablet 600 mg (600 mg Oral Given 03/04/22  1408)                                                                                                                                     Procedures Procedures  (including critical care time)  Medical Decision Making / ED Course   This patient presents to the ED for concern of cough, fever, ear pain, this involves an extensive number of treatment options, and is a complaint that carries with it a high risk of complications and morbidity.  The differential diagnosis includes COVID-19, influenza, unspecified viral URI, pneumonia  MDM: Patient seen emergency room for evaluation of cough and fever.  Physical exam reveals rhinorrhea but lung exam otherwise unremarkable.  Patient found to be febrile here in the ER and was given ibuprofen leading to resolution of fever.  Patient influenza positive which likely explains her symptoms.  Patient arrives with her 2 children and their viral test is pending but as she has influenza, I expect they do as well.  Patient is not a Tamiflu candidate at this time given duration of symptoms.  Patient not requiring oxygen here in the ER, and measured tachycardia is secondary to fever and current influenza.  Patient then discharged with outpatient follow-up   Additional history obtained:  -External records from outside source obtained and reviewed including: Chart review including previous notes, labs, imaging, consultation notes   Lab Tests: -I ordered, reviewed, and interpreted labs.   The pertinent results include:   Labs Reviewed  RESP PANEL BY RT-PCR (RSV, FLU A&B, COVID)  RVPGX2        Medicines ordered and prescription drug management: Meds ordered this encounter  Medications   ibuprofen (ADVIL) tablet 600 mg    -I have reviewed the patients home medicines and have made adjustments as needed  Critical interventions none   Cardiac Monitoring: The patient was maintained on a cardiac monitor.  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

## 2022-03-09 ENCOUNTER — Emergency Department (HOSPITAL_COMMUNITY)
Admission: EM | Admit: 2022-03-09 | Discharge: 2022-03-09 | Disposition: A | Discharge status: Home / Self Care | Payer: Medicaid Other | Attending: Emergency Medicine | Admitting: Emergency Medicine

## 2022-03-09 ENCOUNTER — Other Ambulatory Visit: Payer: Self-pay

## 2022-03-09 DIAGNOSIS — Z1152 Encounter for screening for COVID-19: Secondary | ICD-10-CM | POA: Diagnosis not present

## 2022-03-09 DIAGNOSIS — H6691 Otitis media, unspecified, right ear: Secondary | ICD-10-CM | POA: Diagnosis not present

## 2022-03-09 DIAGNOSIS — J0100 Acute maxillary sinusitis, unspecified: Secondary | ICD-10-CM | POA: Diagnosis not present

## 2022-03-09 DIAGNOSIS — J101 Influenza due to other identified influenza virus with other respiratory manifestations: Secondary | ICD-10-CM | POA: Diagnosis not present

## 2022-03-09 DIAGNOSIS — H9201 Otalgia, right ear: Secondary | ICD-10-CM | POA: Diagnosis present

## 2022-03-09 LAB — RESP PANEL BY RT-PCR (RSV, FLU A&B, COVID)  RVPGX2
Influenza A by PCR: NEGATIVE
Influenza B by PCR: POSITIVE — AB
Resp Syncytial Virus by PCR: NEGATIVE
SARS Coronavirus 2 by RT PCR: NEGATIVE

## 2022-03-09 MED ORDER — AMOXICILLIN-POT CLAVULANATE 875-125 MG PO TABS: 1.0000 | ORAL_TABLET | Freq: Two times a day (BID) | ORAL | 0 refills | Status: DC

## 2022-03-09 NOTE — Discharge Instructions (Addendum)
Seen today for right ear infection and sinus infection, he started on antibiotics.  Continue using the nasal spray you are given at your last visit and follow-up with primary care, come back to the ER for new or worsening symptoms.

## 2022-03-09 NOTE — ED Triage Notes (Signed)
C/o right facial swelling with dental x1 week.  Pt reports decreased hearing to right ear Diagnosed with flu 1 week ago.

## 2022-03-09 NOTE — ED Provider Notes (Signed)
[facility] EMERGENCY DEPARTMENT AT [facility] Provider Note   CSN: [id number] Arrival date & time: 03/09/22  1649     History  Chief Complaint  Patient presents with   Dental Pain    [name] [name] is a 27 y.o. female.  Comes in with about 8 days of right ear pain and sinus congestion, she was seen 5 days ago diagnosed with flu B, she states that since then she has had increased pressure and pain in the right maxillary area, denies any dental pain difficulty eating, trouble swallowing or breathing.  States the ear pain is also worse.  She has been using the Flonase as prescribed without relief.  She denies any fevers or chills, no shortness of breath or cough.   Dental Pain Associated symptoms: congestion        Home Medications Prior to Admission medications   Medication Sig Start Date End Date Taking? Authorizing Provider  amoxicillin-clavulanate (AUGMENTIN) 875-125 MG tablet Take 1 tablet by mouth every 12 (twelve) hours. 03/09/22  Yes Garlan Drewes A, PA-C  benzonatate (TESSALON) 100 MG capsule Take 1 capsule (100 mg total) by mouth every 8 (eight) hours. 03/04/22   Long, Wonda Olds, MD  fluticasone (FLONASE) 50 MCG/ACT nasal spray Place 2 sprays into both nostrils daily for 7 days. 03/04/22 03/11/22  Long, Wonda Olds, MD  ibuprofen (ADVIL,MOTRIN) 600 MG tablet Take 1 tablet (600 mg total) by mouth every 6 (six) hours as needed for mild pain. 11/01/13   Newton Pigg, MD  lidocaine (LIDODERM) 5 % Place 1 patch onto the skin daily. Remove & Discard patch within 12 hours or as directed by MD 07/09/21   Sherrell Puller, PA-C  lidocaine (XYLOCAINE) 2 % solution Use as directed 10 mLs in the mouth or throat as needed for mouth pain. 01/07/20   Volney American, PA-C  methocarbamol (ROBAXIN) 500 MG tablet Take 1 tablet (500 mg total) by mouth 2 (two) times daily. 07/09/21   Sherrell Puller, PA-C  naproxen (NAPROSYN) 500 MG tablet Take 1 tablet (500 mg total) by mouth 2 (two) times  daily as needed for moderate pain. 08/19/19   Corena Herter, PA-C  oxyCODONE-acetaminophen (PERCOCET/ROXICET) 5-325 MG tablet Take 2 tablets by mouth every 8 (eight) hours as needed for severe pain. 08/19/19   Corena Herter, PA-C  oxyCODONE-acetaminophen (PERCOCET/ROXICET) 5-325 MG tablet Take 1 tablet by mouth every 6 (six) hours as needed for severe pain. 01/08/20   Cuthriell, Charline Bills, PA-C  Prenatal Vit-Fe Fumarate-FA (PRENATAL MULTIVITAMIN) TABS tablet Take 1 tablet by mouth daily.    [provider]  traMADol (ULTRAM) 50 MG tablet Take 1 tablet (50 mg total) by mouth every 12 (twelve) hours as needed. 01/07/20   Volney American, PA-C      Allergies    Morphine and Vicodin [hydrocodone-acetaminophen]    Review of Systems   Review of Systems  HENT:  Positive for congestion, ear pain and sinus pain.     Physical Exam Updated Vital Signs BP 112/89 (BP Location: Left Arm)   Pulse 94   Temp 98.4 F (36.9 C) (Oral)   Resp 18   Ht 5\' 2"  (1.575 m)   Wt 80.7 kg   SpO2 99%   BMI 32.56 kg/m  Physical Exam Vitals and nursing note reviewed.  Constitutional:      General: She is not in acute distress.    Appearance: She is well-developed.  HENT:     Head: Normocephalic  and atraumatic.     Right Ear: Ear canal and external ear normal. Tympanic membrane is erythematous.     Left Ear: Tympanic membrane, ear canal and external ear normal.  Eyes:     Conjunctiva/sclera: Conjunctivae normal.  Cardiovascular:     Rate and Rhythm: Normal rate and regular rhythm.     Heart sounds: No murmur heard. Pulmonary:     Effort: Pulmonary effort is normal. No respiratory distress.     Breath sounds: Normal breath sounds.  Abdominal:     Palpations: Abdomen is soft.     Tenderness: There is no abdominal tenderness.  Musculoskeletal:        General: No swelling.     Cervical back: Neck supple.  Skin:    General: Skin is warm and dry.     Capillary Refill: Capillary refill  takes less than 2 seconds.  Neurological:     Mental Status: She is alert.  Psychiatric:        Mood and Affect: Mood normal.     ED Results / Procedures / Treatments   Labs (all labs ordered are listed, but only abnormal results are displayed) Labs Reviewed  RESP PANEL BY RT-PCR (RSV, FLU A&B, COVID)  RVPGX2    EKG None  Radiology No results found.  Procedures Procedures    Medications Ordered in ED Medications - No data to display  ED Course/ Medical Decision Making/ A&P                             Medical Decision Making ED Course: Patient having 8 days of right ear pain and now having right maxillary sinus pain, symptoms worsening, initially diagnosed with flu B and states fevers have away but now she is having sinus tenderness and worsening ear pain, she does have acute otitis media will treat with Augmentin which will treat both sinusitis and ear infection.  She is advised to keep taking the Flonase and advised on PCP follow-up and return precautions.  Her vitals are reviewed and reassuring.  DDX: Otitis media, otitis externa, sinusitis, dental abscess, allergic  Risk Prescription drug management.           Final Clinical Impression(s) / ED Diagnoses Final diagnoses:  Right otitis media, unspecified otitis media type  Acute non-recurrent maxillary sinusitis    Rx / DC Orders ED Discharge Orders          Ordered    amoxicillin-clavulanate (AUGMENTIN) 875-125 MG tablet  Every 12 hours        03/09/22 1804              Darci Current 03/09/22 1809    Dorie Rank, MD 03/12/22 1704

## 2023-03-10 ENCOUNTER — Other Ambulatory Visit: Payer: Self-pay

## 2023-03-10 ENCOUNTER — Emergency Department (HOSPITAL_COMMUNITY)
Admission: EM | Admit: 2023-03-10 | Discharge: 2023-03-10 | Disposition: A | Discharge status: Home / Self Care | Payer: No Typology Code available for payment source | Attending: Emergency Medicine | Admitting: Emergency Medicine

## 2023-03-10 DIAGNOSIS — H6692 Otitis media, unspecified, left ear: Secondary | ICD-10-CM | POA: Insufficient documentation

## 2023-03-10 DIAGNOSIS — H60501 Unspecified acute noninfective otitis externa, right ear: Secondary | ICD-10-CM | POA: Insufficient documentation

## 2023-03-10 DIAGNOSIS — H6691 Otitis media, unspecified, right ear: Secondary | ICD-10-CM

## 2023-03-10 DIAGNOSIS — H9201 Otalgia, right ear: Secondary | ICD-10-CM | POA: Diagnosis present

## 2023-03-10 MED ORDER — AMOXICILLIN-POT CLAVULANATE 875-125 MG PO TABS: 1.0000 | ORAL_TABLET | Freq: Two times a day (BID) | ORAL | 0 refills | Status: DC

## 2023-03-10 MED ORDER — OFLOXACIN 0.3 % OP SOLN
10.0000 [drp] | Freq: Once | OPHTHALMIC | Status: AC
  Administered 2023-03-10: 10 [drp] via OTIC
  Filled 2023-03-10: qty 5

## 2023-03-10 NOTE — ED Provider Notes (Signed)
[place] EMERGENCY DEPARTMENT AT [facility] Provider Note   CSN: [id number] Arrival date & time: 03/10/23  5176     History  Chief Complaint  Patient presents with   Ear Pain    [name] [name] is a 28 y.o. female.  [name] [name] is a 28 y.o. female with a history of recurrent ear infections, who presents to the ED for evaluation of days of right ear pain.  She has noted a small amount of purulent nonbloody drainage from the right ear.  No change in hearing.  She reports she was sick with upper respiratory symptoms last week and these often precede her ear infections.  All URI symptoms have resolved.  No fevers.  Fever.  No medications for symptoms prior to arrival.        Home Medications Prior to Admission medications   Medication Sig Start Date End Date Taking? Authorizing Provider  amoxicillin-clavulanate (AUGMENTIN) 875-125 MG tablet Take 1 tablet by mouth every 12 (twelve) hours. 03/10/23   Rosezella Rumpf, PA-C  benzonatate (TESSALON) 100 MG capsule Take 1 capsule (100 mg total) by mouth every 8 (eight) hours. 03/04/22   Long, Arlyss Repress, MD  fluticasone (FLONASE) 50 MCG/ACT nasal spray Place 2 sprays into both nostrils daily for 7 days. 03/04/22 03/11/22  Long, Arlyss Repress, MD  ibuprofen (ADVIL,MOTRIN) 600 MG tablet Take 1 tablet (600 mg total) by mouth every 6 (six) hours as needed for mild pain. 11/01/13   Tracey Harries, MD  lidocaine (LIDODERM) 5 % Place 1 patch onto the skin daily. Remove & Discard patch within 12 hours or as directed by MD 07/09/21   Achille Rich, PA-C  lidocaine (XYLOCAINE) 2 % solution Use as directed 10 mLs in the mouth or throat as needed for mouth pain. 01/07/20   Particia Nearing, PA-C  methocarbamol (ROBAXIN) 500 MG tablet Take 1 tablet (500 mg total) by mouth 2 (two) times daily. 07/09/21   Achille Rich, PA-C  naproxen (NAPROSYN) 500 MG tablet Take 1 tablet (500 mg total) by mouth 2 (two) times daily as needed for moderate pain. 08/19/19    Lorelee New, PA-C  oxyCODONE-acetaminophen (PERCOCET/ROXICET) 5-325 MG tablet Take 2 tablets by mouth every 8 (eight) hours as needed for severe pain. 08/19/19   Lorelee New, PA-C  oxyCODONE-acetaminophen (PERCOCET/ROXICET) 5-325 MG tablet Take 1 tablet by mouth every 6 (six) hours as needed for severe pain. 01/08/20   Cuthriell, Delorise Royals, PA-C  Prenatal Vit-Fe Fumarate-FA (PRENATAL MULTIVITAMIN) TABS tablet Take 1 tablet by mouth daily.    [provider]  traMADol (ULTRAM) 50 MG tablet Take 1 tablet (50 mg total) by mouth every 12 (twelve) hours as needed. 01/07/20   Particia Nearing, PA-C      Allergies    Morphine and Vicodin [hydrocodone-acetaminophen]    Review of Systems   Review of Systems  Constitutional:  Negative for chills and fever.  HENT:  Positive for ear discharge and ear pain.     Physical Exam Updated Vital Signs BP (!) 147/99   Pulse 78   Temp 97.8 F (36.6 C) (Oral)   Resp 17   Ht 5\' 1"  (1.549 m)   Wt 83.9 kg   LMP 02/16/2023 (Exact Date)   SpO2 100%   BMI 34.96 kg/m  Physical Exam Vitals and nursing note reviewed.  Constitutional:      General: She is not in acute distress.    Appearance: Normal appearance. She is  well-developed. She is not ill-appearing or diaphoretic.  HENT:     Head: Normocephalic and atraumatic.     Left Ear: Tympanic membrane, ear canal and external ear normal.     Ears:     Comments: Right ear canal with some erythema and edema, small amount of wax present, no current discharge.  TM is erythematous and bulging. Eyes:     General:        Right eye: No discharge.        Left eye: No discharge.  Pulmonary:     Effort: Pulmonary effort is normal. No respiratory distress.  Neurological:     Mental Status: She is alert and oriented to person, place, and time.     Coordination: Coordination normal.  Psychiatric:        Mood and Affect: Mood normal.        Behavior: Behavior normal.     ED Results /  Procedures / Treatments   Labs (all labs ordered are listed, but only abnormal results are displayed) Labs Reviewed - No data to display  EKG None  Radiology No results found.  Procedures Procedures    Medications Ordered in ED Medications  ofloxacin (OCUFLOX) 0.3 % ophthalmic solution 10 drop (has no administration in time range)    ED Course/ Medical Decision Making/ A&P                                 Medical Decision Making  28 year old female presents with right ear pain after recent upper respiratory infection.  Symptoms present for 3 days with some drainage from the right ear.  No hearing loss.  On exam patient has signs of both otitis externa and media.  Will treat with ofloxacin drops and Augmentin.  TMs clearly visualized and intact, no indication for ear wick.  At this time there does not appear to be any evidence of an acute emergency medical condition requiring further emergent evaluation and the patient appears stable for discharge with appropriate outpatient follow up. Diagnosis and return precautions discussed with patient who verbalizes understanding and is agreeable to discharge.         Final Clinical Impression(s) / ED Diagnoses Final diagnoses:  Right otitis media, unspecified otitis media type  Acute otitis externa of right ear, unspecified type    Rx / DC Orders ED Discharge Orders          Ordered    amoxicillin-clavulanate (AUGMENTIN) 875-125 MG tablet  Every 12 hours        03/10/23 0829              Rosezella Rumpf, PA-C 03/10/23 0454    Cathren Laine, MD 03/11/23 [phone]

## 2023-03-10 NOTE — ED Triage Notes (Signed)
Pt arrived reporting R ear pain. States she was sick over the last week and ear pain started a few days after. Drainage from right ear. Denies any other symptoms or concerns at this time.

## 2023-03-10 NOTE — Discharge Instructions (Addendum)
Take oral antibiotics twice daily for 7 days, and use antibiotic drops 10 drops in the right ear once daily for 7 days.  Finish entire course of both antibiotics even if your symptoms resolve after a few days.

## 2023-04-27 ENCOUNTER — Emergency Department (HOSPITAL_COMMUNITY)
Admission: EM | Admit: 2023-04-27 | Discharge: 2023-04-27 | Disposition: A | Discharge status: Home / Self Care | Attending: Emergency Medicine | Admitting: Emergency Medicine

## 2023-04-27 ENCOUNTER — Other Ambulatory Visit: Payer: Self-pay

## 2023-04-27 DIAGNOSIS — H6091 Unspecified otitis externa, right ear: Secondary | ICD-10-CM | POA: Diagnosis not present

## 2023-04-27 DIAGNOSIS — H60501 Unspecified acute noninfective otitis externa, right ear: Secondary | ICD-10-CM

## 2023-04-27 DIAGNOSIS — H9201 Otalgia, right ear: Secondary | ICD-10-CM | POA: Diagnosis present

## 2023-04-27 MED ORDER — CIPRO HC 0.2-1 % OT SUSP: 3.0000 [drp] | Freq: Two times a day (BID) | OTIC | 0 refills | Status: AC

## 2023-04-27 NOTE — ED Provider Notes (Signed)
 [place] EMERGENCY DEPARTMENT AT [facility] Provider Note   CSN: [id number] Arrival date & time: 04/27/23  1117     History  Chief Complaint  Patient presents with   Ear Pain    [name] [name] is a 28 y.o. female presents today for right ear pain x 1 day.  Patient endorses some liquid drainage from the ear as well.  Patient denies fever, chills, congestion, fever, or tinnitus.  HPI     Home Medications Prior to Admission medications   Medication Sig Start Date End Date Taking? Authorizing Provider  ciprofloxacin-hydrocortisone (CIPRO HC) OTIC suspension Place 3 drops into the right ear 2 (two) times daily for 7 days. 04/27/23 05/04/23 Yes Dolphus Jenny, PA-C  amoxicillin-clavulanate (AUGMENTIN) 875-125 MG tablet Take 1 tablet by mouth every 12 (twelve) hours. 03/10/23   Rosezella Rumpf, PA-C  benzonatate (TESSALON) 100 MG capsule Take 1 capsule (100 mg total) by mouth every 8 (eight) hours. 03/04/22   Long, Arlyss Repress, MD  fluticasone (FLONASE) 50 MCG/ACT nasal spray Place 2 sprays into both nostrils daily for 7 days. 03/04/22 03/11/22  Long, Arlyss Repress, MD  ibuprofen (ADVIL,MOTRIN) 600 MG tablet Take 1 tablet (600 mg total) by mouth every 6 (six) hours as needed for mild pain. 11/01/13   Tracey Harries, MD  lidocaine (LIDODERM) 5 % Place 1 patch onto the skin daily. Remove & Discard patch within 12 hours or as directed by MD 07/09/21   Achille Rich, PA-C  lidocaine (XYLOCAINE) 2 % solution Use as directed 10 mLs in the mouth or throat as needed for mouth pain. 01/07/20   Particia Nearing, PA-C  methocarbamol (ROBAXIN) 500 MG tablet Take 1 tablet (500 mg total) by mouth 2 (two) times daily. 07/09/21   Achille Rich, PA-C  naproxen (NAPROSYN) 500 MG tablet Take 1 tablet (500 mg total) by mouth 2 (two) times daily as needed for moderate pain. 08/19/19   Lorelee New, PA-C  oxyCODONE-acetaminophen (PERCOCET/ROXICET) 5-325 MG tablet Take 2 tablets by mouth every 8 (eight) hours  as needed for severe pain. 08/19/19   Lorelee New, PA-C  oxyCODONE-acetaminophen (PERCOCET/ROXICET) 5-325 MG tablet Take 1 tablet by mouth every 6 (six) hours as needed for severe pain. 01/08/20   Cuthriell, Delorise Royals, PA-C  Prenatal Vit-Fe Fumarate-FA (PRENATAL MULTIVITAMIN) TABS tablet Take 1 tablet by mouth daily.    [provider]  traMADol (ULTRAM) 50 MG tablet Take 1 tablet (50 mg total) by mouth every 12 (twelve) hours as needed. 01/07/20   Particia Nearing, PA-C      Allergies    Morphine and Vicodin [hydrocodone-acetaminophen]    Review of Systems   Review of Systems  HENT:  Positive for ear discharge and ear pain.     Physical Exam Updated Vital Signs BP (!) 159/96   Pulse 91   Temp 98.4 F (36.9 C) (Oral)   Resp 18   Ht 5\' 1"  (1.549 m)   Wt 83.9 kg   LMP 03/30/2023 (Approximate)   SpO2 99%   BMI 34.95 kg/m  Physical Exam Vitals and nursing note reviewed.  Constitutional:      General: She is not in acute distress.    Appearance: Normal appearance. She is well-developed. She is not ill-appearing, toxic-appearing or diaphoretic.  HENT:     Head: Normocephalic and atraumatic.     Right Ear: External ear normal.     Left Ear: Tympanic membrane and external ear normal.  Ears:     Comments: Debride in right ear canal with swelling    Nose: Nose normal.     Mouth/Throat:     Mouth: Mucous membranes are moist.     Pharynx: Oropharynx is clear.  Eyes:     Conjunctiva/sclera: Conjunctivae normal.  Cardiovascular:     Rate and Rhythm: Normal rate and regular rhythm.     Pulses: Normal pulses.     Heart sounds: Normal heart sounds. No murmur heard. Pulmonary:     Effort: Pulmonary effort is normal. No respiratory distress.     Breath sounds: Normal breath sounds.  Abdominal:     Palpations: Abdomen is soft.     Tenderness: There is no abdominal tenderness.  Musculoskeletal:        General: No swelling.     Cervical back: Normal range of  motion and neck supple.  Skin:    General: Skin is warm and dry.     Capillary Refill: Capillary refill takes less than 2 seconds.  Neurological:     General: No focal deficit present.     Mental Status: She is alert.  Psychiatric:        Mood and Affect: Mood normal.     ED Results / Procedures / Treatments   Labs (all labs ordered are listed, but only abnormal results are displayed) Labs Reviewed - No data to display  EKG None  Radiology No results found.  Procedures Procedures    Medications Ordered in ED Medications - No data to display  ED Course/ Medical Decision Making/ A&P                                 Medical Decision Making Risk Prescription drug management.   This patient presents to the ED with chief complaint(s) of R ear pain with pertinent past medical history of none which further complicates the presenting complaint. The complaint involves an extensive differential diagnosis and also carries with it a high risk of complications and morbidity.    The differential diagnosis includes otitis media, acute otitis externa, congestion, ear pain  ED Course and Reassessment:   Consultation: - Consulted or discussed management/test interpretation w/ external professional: None  Consideration for admission or further workup: Considered for admission or further workup however patient's vital signs and physical exam are reassuring.  Patient's symptoms likely due to acute otitis externa of the right ear.  Patient given ciprofloxacin-hydrocortisone otic drops to use twice daily for 7 days.  Patient should follow-up with her primary care if her symptoms persist for further evaluation and treatment.         Final Clinical Impression(s) / ED Diagnoses Final diagnoses:  Acute otitis externa of right ear, unspecified type    Rx / DC Orders ED Discharge Orders          Ordered    ciprofloxacin-hydrocortisone (CIPRO HC) OTIC suspension  2 times daily         04/27/23 1547              Dolphus Jenny, PA-C 04/27/23 1552    Margarita Grizzle, MD 05/03/23 1042

## 2023-04-27 NOTE — Discharge Instructions (Signed)
 Today you are seen for acute otitis externa of the right ear.  Please pick up your medication and take as prescribed.  Thank you for letting us treat you today. After performing a physical exam, I feel you are safe to go home. Please follow up with your PCP in the next several days and provide them with your records from this visit. Return to the Emergency Room if pain becomes severe or symptoms worsen.

## 2023-04-27 NOTE — ED Triage Notes (Signed)
 Pt arrived reporting R ear pain x1 day. Fluid coming from ear. No other symptoms

## 2023-04-28 ENCOUNTER — Telehealth (HOSPITAL_COMMUNITY): Payer: Self-pay | Admitting: Emergency Medicine

## 2023-04-28 MED ORDER — CIPROFLOXACIN HCL 0.2 % OT SOLN: 0.2000 mL | Freq: Two times a day (BID) | OTIC | 0 refills | Status: AC

## 2023-04-30 ENCOUNTER — Emergency Department (HOSPITAL_COMMUNITY)
Admission: EM | Admit: 2023-04-30 | Discharge: 2023-04-30 | Disposition: A | Discharge status: Home / Self Care | Attending: Emergency Medicine | Admitting: Emergency Medicine

## 2023-04-30 ENCOUNTER — Encounter (HOSPITAL_COMMUNITY): Payer: Self-pay | Admitting: Emergency Medicine

## 2023-04-30 ENCOUNTER — Other Ambulatory Visit: Payer: Self-pay

## 2023-04-30 DIAGNOSIS — H9201 Otalgia, right ear: Secondary | ICD-10-CM | POA: Insufficient documentation

## 2023-04-30 MED ORDER — OXYCODONE-ACETAMINOPHEN 5-325 MG PO TABS
1.0000 | ORAL_TABLET | Freq: Once | ORAL | Status: AC
  Administered 2023-04-30: 1 via ORAL
  Filled 2023-04-30: qty 1

## 2023-04-30 MED ORDER — CIPROFLOXACIN HCL 500 MG PO TABS: 500.0000 mg | ORAL_TABLET | Freq: Two times a day (BID) | ORAL | 0 refills | Status: DC

## 2023-04-30 MED ORDER — NEOMYCIN-POLYMYXIN-HC 3.5-10000-1 OT SUSP: 4.0000 [drp] | Freq: Four times a day (QID) | OTIC | 0 refills | Status: AC

## 2023-04-30 MED ORDER — KETOROLAC TROMETHAMINE 15 MG/ML IJ SOLN
15.0000 mg | Freq: Once | INTRAMUSCULAR | Status: AC
Start: 2023-04-30 — End: 2023-04-30
  Administered 2023-04-30: 15 mg via INTRAMUSCULAR
  Filled 2023-04-30: qty 1

## 2023-04-30 NOTE — Discharge Instructions (Signed)
 Please begin taking antibiotic ciprofloxacin 500 mg twice a day for 5 days.  Please also begin utilizing neomycin eardrops 4 times a day, 4 drops in the right ear.  Please follow-up with ENT specialist.  Please return to the ED with any new or worsening symptoms.  You may take Tylenol and ibuprofen for pain in the interim.

## 2023-04-30 NOTE — ED Provider Notes (Signed)
 [place] EMERGENCY DEPARTMENT AT [facility] Provider Note   CSN: [id number] Arrival date & time: 04/30/23  1304     History  Chief Complaint  Patient presents with   Otalgia    [name] [name] is a 28 y.o. female with history of recurrent ear infections.  Patient presents to ED for evaluation of right ear pain.  States that pain began about 5 days ago.  States that she was seen in the ED on 3/12 and provided with ciprofloxacin drops.  States she has been disease as directed but pain persists.  She reports that the ciprofloxacin drops actually worsen her pain.  She has never seen an ENT specialist before.  She denies fevers, nausea or vomiting.  She denies drainage from the ear.  Reports at 1 point in her youth she was post to get tympanostomy tubes placed but this never occurred.   Otalgia      Home Medications Prior to Admission medications   Medication Sig Start Date End Date Taking? Authorizing Provider  ciprofloxacin (CIPRO) 500 MG tablet Take 1 tablet (500 mg total) by mouth every 12 (twelve) hours. 04/30/23  Yes Al Decant, PA-C  neomycin-polymyxin-hydrocortisone (CORTISPORIN) 3.5-10000-1 OTIC suspension Place 4 drops into the right ear 4 (four) times daily. 04/30/23  Yes Al Decant, PA-C  amoxicillin-clavulanate (AUGMENTIN) 875-125 MG tablet Take 1 tablet by mouth every 12 (twelve) hours. 03/10/23   Rosezella Rumpf, PA-C  benzonatate (TESSALON) 100 MG capsule Take 1 capsule (100 mg total) by mouth every 8 (eight) hours. 03/04/22   Long, Arlyss Repress, MD  Ciprofloxacin HCl 0.2 % otic solution Place 0.2 mLs into both ears 2 (two) times daily for 7 days. 04/28/23 05/05/23  Fayrene Helper, PA-C  ciprofloxacin-hydrocortisone (CIPRO HC) OTIC suspension Place 3 drops into the right ear 2 (two) times daily for 7 days. 04/27/23 05/04/23  Dolphus Jenny, PA-C  fluticasone (FLONASE) 50 MCG/ACT nasal spray Place 2 sprays into both nostrils daily for 7 days. 03/04/22 03/11/22   Long, Arlyss Repress, MD  ibuprofen (ADVIL,MOTRIN) 600 MG tablet Take 1 tablet (600 mg total) by mouth every 6 (six) hours as needed for mild pain. 11/01/13   Tracey Harries, MD  lidocaine (LIDODERM) 5 % Place 1 patch onto the skin daily. Remove & Discard patch within 12 hours or as directed by MD 07/09/21   Achille Rich, PA-C  lidocaine (XYLOCAINE) 2 % solution Use as directed 10 mLs in the mouth or throat as needed for mouth pain. 01/07/20   Particia Nearing, PA-C  methocarbamol (ROBAXIN) 500 MG tablet Take 1 tablet (500 mg total) by mouth 2 (two) times daily. 07/09/21   Achille Rich, PA-C  naproxen (NAPROSYN) 500 MG tablet Take 1 tablet (500 mg total) by mouth 2 (two) times daily as needed for moderate pain. 08/19/19   Lorelee New, PA-C  oxyCODONE-acetaminophen (PERCOCET/ROXICET) 5-325 MG tablet Take 2 tablets by mouth every 8 (eight) hours as needed for severe pain. 08/19/19   Lorelee New, PA-C  oxyCODONE-acetaminophen (PERCOCET/ROXICET) 5-325 MG tablet Take 1 tablet by mouth every 6 (six) hours as needed for severe pain. 01/08/20   Cuthriell, Delorise Royals, PA-C  Prenatal Vit-Fe Fumarate-FA (PRENATAL MULTIVITAMIN) TABS tablet Take 1 tablet by mouth daily.    [provider]  traMADol (ULTRAM) 50 MG tablet Take 1 tablet (50 mg total) by mouth every 12 (twelve) hours as needed. 01/07/20   Particia Nearing, PA-C  Allergies    Morphine and Vicodin [hydrocodone-acetaminophen]    Review of Systems   Review of Systems  HENT:  Positive for ear pain.     Physical Exam Updated Vital Signs BP (!) 153/103 (BP Location: Right Arm)   Pulse 88   Temp 98.1 F (36.7 C)   Resp 18   Ht 5\' 1"  (1.549 m)   Wt 83.9 kg   LMP 03/30/2023 (Approximate)   SpO2 100%   BMI 34.96 kg/m  Physical Exam Vitals and nursing note reviewed.  Constitutional:      General: She is not in acute distress.    Appearance: She is well-developed.  HENT:     Head: Normocephalic and atraumatic.      Right Ear: Swelling and tenderness present.     Ears:     Comments: Patient has a swollen, tender EAC.  TM appears to be bulging. Eyes:     Conjunctiva/sclera: Conjunctivae normal.  Cardiovascular:     Rate and Rhythm: Normal rate and regular rhythm.     Heart sounds: No murmur heard. Pulmonary:     Effort: Pulmonary effort is normal. No respiratory distress.     Breath sounds: Normal breath sounds.  Abdominal:     Palpations: Abdomen is soft.     Tenderness: There is no abdominal tenderness.  Musculoskeletal:        General: No swelling.     Cervical back: Neck supple.  Skin:    General: Skin is warm and dry.     Capillary Refill: Capillary refill takes less than 2 seconds.  Neurological:     Mental Status: She is alert.  Psychiatric:        Mood and Affect: Mood normal.     ED Results / Procedures / Treatments   Labs (all labs ordered are listed, but only abnormal results are displayed) Labs Reviewed - No data to display  EKG None  Radiology No results found.  Procedures Procedures   Medications Ordered in ED Medications  ketorolac (TORADOL) 15 MG/ML injection 15 mg (15 mg Intramuscular Given 04/30/23 1503)    ED Course/ Medical Decision Making/ A&P  Medical Decision Making Risk Prescription drug management.   28 year old female presents for evaluation.  Please see HPI for further details.  On examination patient is afebrile and nontachycardic.  Her lung sounds are clear bilaterally, she is not hypoxic.  Abdomen is soft and compressible throughout.  Neurological examinations at baseline.  Patient right ear has a swollen, tender EAC.  The right TM appears to be bulging.  At this time, question both otitis media and otitis externa.  Will start patient on Augmentin which she will take for the next 7 days twice a day.  Will also change patient antibiotic eardrop.  Discussed with patient.  She voices understanding.  All questions answered to the patient  satisfaction.  Stable to discharge.   Final Clinical Impression(s) / ED Diagnoses Final diagnoses:  Otalgia of right ear    Rx / DC Orders ED Discharge Orders          Ordered    ciprofloxacin (CIPRO) 500 MG tablet  Every 12 hours        04/30/23 1542    neomycin-polymyxin-hydrocortisone (CORTISPORIN) 3.5-10000-1 OTIC suspension  4 times daily        04/30/23 1542              Clent Ridges 04/30/23 1543    Benjiman Core, MD 04/30/23 2149

## 2023-04-30 NOTE — ED Notes (Signed)
 PT reports a pain level of 9/10 after pain medication intervention. PT states only relief comes from having the blanket pressed against her ear with pressure.

## 2023-04-30 NOTE — ED Triage Notes (Signed)
 Pt reports right ear pain for the last 4 days. Pt has been taking cipro drops for the last 4 days without relief. VSS.

## 2023-08-02 ENCOUNTER — Institutional Professional Consult (permissible substitution) (INDEPENDENT_AMBULATORY_CARE_PROVIDER_SITE_OTHER): Admitting: Otolaryngology

## 2023-08-02 ENCOUNTER — Ambulatory Visit (INDEPENDENT_AMBULATORY_CARE_PROVIDER_SITE_OTHER): Admitting: Audiology

## 2023-09-18 ENCOUNTER — Emergency Department (HOSPITAL_COMMUNITY): Admission: EM | Admit: 2023-09-18 | Discharge: 2023-09-18 | Disposition: A | Discharge status: Home / Self Care

## 2023-09-18 ENCOUNTER — Encounter (HOSPITAL_COMMUNITY): Payer: Self-pay | Admitting: *Deleted

## 2023-09-18 ENCOUNTER — Other Ambulatory Visit: Payer: Self-pay

## 2023-09-18 DIAGNOSIS — K0889 Other specified disorders of teeth and supporting structures: Secondary | ICD-10-CM | POA: Diagnosis present

## 2023-09-18 DIAGNOSIS — K047 Periapical abscess without sinus: Secondary | ICD-10-CM | POA: Insufficient documentation

## 2023-09-18 MED ORDER — AMOXICILLIN 500 MG PO CAPS
500.0000 mg | ORAL_CAPSULE | Freq: Once | ORAL | Status: AC
  Administered 2023-09-18: 500 mg via ORAL
  Filled 2023-09-18: qty 1

## 2023-09-18 MED ORDER — NAPROXEN 500 MG PO TABS: 500.0000 mg | ORAL_TABLET | Freq: Two times a day (BID) | ORAL | 0 refills | Status: AC

## 2023-09-18 MED ORDER — AMOXICILLIN 500 MG PO CAPS: 500.0000 mg | ORAL_CAPSULE | Freq: Three times a day (TID) | ORAL | 0 refills | Status: AC

## 2023-09-18 MED ORDER — NAPROXEN 500 MG PO TABS
500.0000 mg | ORAL_TABLET | Freq: Once | ORAL | Status: AC
Start: 2023-09-18 — End: 2023-09-18
  Administered 2023-09-18: 500 mg via ORAL
  Filled 2023-09-18: qty 1

## 2023-09-18 NOTE — Discharge Instructions (Signed)
 Follow-up with a dentist.  Only a dentist can fix your problem.  We recommend that you take amoxicillin  as prescribed to treat likely underlying infection.  Take naproxen  as prescribed for pain control.  Do not drive or drink alcohol after taking this medication as it may make you drowsy and impair your judgment.  Return to the ED for any new or concerning symptoms.

## 2023-09-18 NOTE — ED Triage Notes (Signed)
 Here byu POV fro recurrent dental pain and swelling. Denies other sx. Denies fever or drainage. Pinpoints to L maxillary central back to molars. Fx'd teeth noted. Denies other sx. H/o same. Last ABT 1.5 yrs ago. No meds PTA.

## 2023-09-18 NOTE — ED Provider Notes (Signed)
 [place] EMERGENCY DEPARTMENT AT [facility] Provider Note   CSN: [id number] Arrival date & time: 09/18/23  1140     Patient presents with: Dental Pain   [name] [name] is a 28 y.o. female.   28 year old female presents to the emergency department for evaluation of recurrent dental pain.  Has developed worsening discomfort to the gingiva associated with her left upper central incisor.  Associated swelling noted.  She denies any fevers or drainage.  Does not have a dentist.  Denies taking any medications for symptoms.  The history is provided by the patient. No language interpreter was used.  Dental Pain      Prior to Admission medications   Medication Sig Start Date End Date Taking? Authorizing Provider  amoxicillin  (AMOXIL ) 500 MG capsule Take 1 capsule (500 mg total) by mouth 3 (three) times daily. 09/18/23  Yes Keith Sor, PA-C  naproxen  (NAPROSYN ) 500 MG tablet Take 1 tablet (500 mg total) by mouth 2 (two) times daily. 09/18/23  Yes Keith Sor, PA-C  benzonatate  (TESSALON ) 100 MG capsule Take 1 capsule (100 mg total) by mouth every 8 (eight) hours. 03/04/22   Long, Joshua G, MD  fluticasone  (FLONASE ) 50 MCG/ACT nasal spray Place 2 sprays into both nostrils daily for 7 days. 03/04/22 03/11/22  LongFonda MATSU, MD  lidocaine  (LIDODERM ) 5 % Place 1 patch onto the skin daily. Remove & Discard patch within 12 hours or as directed by MD 07/09/21   Bernis Ernst, PA-C  lidocaine  (XYLOCAINE ) 2 % solution Use as directed 10 mLs in the mouth or throat as needed for mouth pain. 01/07/20   Stuart Vernell Norris, PA-C  methocarbamol  (ROBAXIN ) 500 MG tablet Take 1 tablet (500 mg total) by mouth 2 (two) times daily. 07/09/21   Bernis Ernst, PA-C  neomycin -polymyxin-hydrocortisone (CORTISPORIN) 3.5-10000-1 OTIC suspension Place 4 drops into the right ear 4 (four) times daily. 04/30/23   Ruthell Lonni FALCON, PA-C  Prenatal Vit-Fe Fumarate-FA (PRENATAL MULTIVITAMIN) TABS tablet Take 1 tablet  by mouth daily.    [provider]    Allergies: Morphine  and Vicodin [hydrocodone -acetaminophen ]    Review of Systems Ten systems reviewed and are negative for acute change, except as noted in the HPI.    Updated Vital Signs BP (!) 154/107 (BP Location: Left Arm)   Pulse 87   Temp 98.5 F (36.9 C) (Oral)   Resp 18   Wt 83.9 kg   LMP 09/16/2023 (Approximate)   SpO2 100%   BMI 34.96 kg/m   Physical Exam Vitals and nursing note reviewed.  Constitutional:      General: She is not in acute distress.    Appearance: She is well-developed. She is not diaphoretic.  HENT:     Head: Normocephalic and atraumatic.     Mouth/Throat:     Comments: Grossly abnormal dentition with various areas of dental fracture.  Multiple areas of dental decay and erosion.  Mild swelling to the gingiva above the left central and lateral incisor.  No active drainage.  Soft oral floor.  Normal phonation.  Tongue midline. Eyes:     General: No scleral icterus.    Conjunctiva/sclera: Conjunctivae normal.  Pulmonary:     Effort: Pulmonary effort is normal. No respiratory distress.  Musculoskeletal:        General: Normal range of motion.     Cervical back: Normal range of motion.  Skin:    General: Skin is warm and dry.     Coloration: Skin is  not pale.     Findings: No erythema or rash.  Neurological:     Mental Status: She is alert and oriented to person, place, and time.  Psychiatric:        Behavior: Behavior normal.     (all labs ordered are listed, but only abnormal results are displayed) Labs Reviewed - No data to display  EKG: None  Radiology: No results found.   Procedures   Medications Ordered in the ED  amoxicillin  (AMOXIL ) capsule 500 mg (500 mg Oral Given 09/18/23 1320)  naproxen  (NAPROSYN ) tablet 500 mg (500 mg Oral Given 09/18/23 1320)                                    Medical Decision Making Risk Prescription drug management.   This patient presents to the  ED for concern of dental pain, this involves an extensive number of treatment options, and is a complaint that carries with it a high risk of complications and morbidity.  The differential diagnosis includes dental infection vs abscess vs Ludwig's angina   Co morbidities that complicate the patient evaluation  None known   Cardiac Monitoring:  The patient was maintained on a cardiac monitor.  I personally viewed and interpreted the cardiac monitored which showed an underlying rhythm of: NSR   Medicines ordered and prescription drug management:  I ordered medication including Naproxen  for pain and Amoxicillin  for infection coverage  Reevaluation of the patient after these medicines showed that the patient stayed the same I have reviewed the patients home medicines and have made adjustments as needed   Test Considered:  Panorex    Problem List / ED Course:  Patient with toothache.  Exam unconcerning for Ludwig's angina or spread of infection.  Will treat with amoxicillin  and pain medicine.  Urged patient to follow-up with dentist.     Reevaluation:  After the interventions noted above, I reevaluated the patient and found that they have :stayed the same   Social Determinants of Health:  Lack of dental care   Dispostion:  After consideration of the diagnostic results and the patients response to treatment, I feel that the patent would benefit from course of abx with close dental follow up. Return precautions discussed and provided. Patient discharged in stable condition with no unaddressed concerns.       Final diagnoses:  Dental infection    ED Discharge Orders          Ordered    amoxicillin  (AMOXIL ) 500 MG capsule  3 times daily        09/18/23 1252    naproxen  (NAPROSYN ) 500 MG tablet  2 times daily        09/18/23 1252               Keith Sor, PA-C 09/18/23 1335    Simon Lavonia SAILOR, MD 09/19/23 [phone]

## 2023-10-14 ENCOUNTER — Other Ambulatory Visit: Payer: Self-pay

## 2023-10-14 ENCOUNTER — Encounter (HOSPITAL_COMMUNITY): Payer: Self-pay

## 2023-10-14 ENCOUNTER — Emergency Department (HOSPITAL_COMMUNITY)
Admission: EM | Admit: 2023-10-14 | Discharge: 2023-10-14 | Disposition: A | Discharge status: Home / Self Care | Attending: Emergency Medicine | Admitting: Emergency Medicine

## 2023-10-14 DIAGNOSIS — T6591XA Toxic effect of unspecified substance, accidental (unintentional), initial encounter: Secondary | ICD-10-CM | POA: Insufficient documentation

## 2023-10-14 LAB — BASIC METABOLIC PANEL WITH GFR
Anion gap: 16 — ABNORMAL HIGH (ref 5–15)
BUN: 13 mg/dL (ref 6–20)
CO2: 19 mmol/L — ABNORMAL LOW (ref 22–32)
Calcium: 9.2 mg/dL (ref 8.9–10.3)
Chloride: 104 mmol/L (ref 98–111)
Creatinine, Ser: 0.65 mg/dL (ref 0.44–1.00)
GFR, Estimated: >60 mL/min (ref >60)
Glucose, Bld: 93 mg/dL (ref 70–99)
Potassium: 3.5 mmol/L (ref 3.5–5.1)
Sodium: 138 mmol/L (ref 135–145)

## 2023-10-14 LAB — URINE DRUG SCREEN
Amphetamines: POSITIVE — AB
Barbiturates: NEGATIVE
Benzodiazepines: NEGATIVE
Cocaine: POSITIVE — AB
Fentanyl: NEGATIVE
Methadone Scn, Ur: NEGATIVE
Opiates: NEGATIVE
Tetrahydrocannabinol: NEGATIVE

## 2023-10-14 LAB — CBC
HCT: 45.4 % (ref 36.0–46.0)
Hemoglobin: 15 g/dL (ref 12.0–15.0)
MCH: 30.5 pg (ref 26.0–34.0)
MCHC: 33 g/dL (ref 30.0–36.0)
MCV: 92.3 fL (ref 80.0–100.0)
Platelets: 217 K/uL (ref 150–400)
RBC: 4.92 MIL/uL (ref 3.87–5.11)
RDW: 13 % (ref 11.5–15.5)
WBC: 8.3 K/uL (ref 4.0–10.5)
nRBC: 0 % (ref 0.0–0.2)

## 2023-10-14 MED ORDER — SODIUM CHLORIDE 0.9% IV SOLUTION: Freq: Once | INTRAVENOUS | Status: DC

## 2023-10-14 MED ORDER — SODIUM CHLORIDE 0.9 % IV BOLUS
1000.0000 mL | Freq: Once | INTRAVENOUS | Status: AC
  Administered 2023-10-14: 1000 mL via INTRAVENOUS

## 2023-10-14 MED ORDER — ONDANSETRON HCL 4 MG/2ML IJ SOLN
4.0000 mg | Freq: Once | INTRAMUSCULAR | Status: AC
  Administered 2023-10-14: 4 mg via INTRAVENOUS
  Filled 2023-10-14: qty 2
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
## 2023-10-14 NOTE — Discharge Instructions (Addendum)
 Your workup today was reassuring other than, your urine drug screen showed cocaine and amphetamines.  Please drink plenty of fluids.  Please follow-up with the health department, primary care doctor.
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
## 2023-10-14 NOTE — ED Notes (Signed)
 PT notified and asked about urine sample & she says I'm still trying
